# Patient Record
Sex: Female | Born: 1960 | Race: White | Hispanic: No | Smoking: Never smoker
Health system: Southern US, Community
[De-identification: ages and names within clinical notes are randomized; demographics above are authoritative.]

## PROBLEM LIST (undated history)

## (undated) DIAGNOSIS — F329 Major depressive disorder, single episode, unspecified: Secondary | ICD-10-CM

## (undated) DIAGNOSIS — F32A Depression, unspecified: Secondary | ICD-10-CM

## (undated) DIAGNOSIS — I10 Essential (primary) hypertension: Secondary | ICD-10-CM

## (undated) DIAGNOSIS — E669 Obesity, unspecified: Secondary | ICD-10-CM

## (undated) HISTORY — PX: CHOLECYSTECTOMY: SHX55

## (undated) HISTORY — DX: Major depressive disorder, single episode, unspecified: F32.9

## (undated) HISTORY — PX: ABDOMINAL HYSTERECTOMY: SHX81

## (undated) HISTORY — PX: APPENDECTOMY: SHX54

## (undated) HISTORY — DX: Obesity, unspecified: E66.9

## (undated) HISTORY — DX: Depression, unspecified: F32.A

---

## 2008-05-22 ENCOUNTER — Ambulatory Visit: Payer: Self-pay | Admitting: Family Medicine

## 2008-05-22 DIAGNOSIS — R740 Nonspecific elevation of levels of transaminase and lactic acid dehydrogenase [LDH]: Secondary | ICD-10-CM

## 2008-05-22 DIAGNOSIS — IMO0002 Reserved for concepts with insufficient information to code with codable children: Secondary | ICD-10-CM | POA: Insufficient documentation

## 2008-05-22 DIAGNOSIS — E1165 Type 2 diabetes mellitus with hyperglycemia: Secondary | ICD-10-CM | POA: Insufficient documentation

## 2008-05-22 DIAGNOSIS — R7401 Elevation of levels of liver transaminase levels: Secondary | ICD-10-CM | POA: Insufficient documentation

## 2008-05-22 DIAGNOSIS — F341 Dysthymic disorder: Secondary | ICD-10-CM | POA: Insufficient documentation

## 2008-05-22 LAB — CONVERTED CEMR LAB: Blood Glucose, Fasting: 152 mg/dL

## 2008-05-29 ENCOUNTER — Ambulatory Visit (HOSPITAL_COMMUNITY): Payer: Self-pay | Admitting: Psychology

## 2008-06-10 DIAGNOSIS — F502 Bulimia nervosa: Secondary | ICD-10-CM | POA: Insufficient documentation

## 2008-06-10 DIAGNOSIS — I10 Essential (primary) hypertension: Secondary | ICD-10-CM | POA: Insufficient documentation

## 2008-06-10 DIAGNOSIS — H8110 Benign paroxysmal vertigo, unspecified ear: Secondary | ICD-10-CM | POA: Insufficient documentation

## 2008-06-10 DIAGNOSIS — G43909 Migraine, unspecified, not intractable, without status migrainosus: Secondary | ICD-10-CM | POA: Insufficient documentation

## 2008-06-10 DIAGNOSIS — G4733 Obstructive sleep apnea (adult) (pediatric): Secondary | ICD-10-CM | POA: Insufficient documentation

## 2008-06-10 DIAGNOSIS — J309 Allergic rhinitis, unspecified: Secondary | ICD-10-CM | POA: Insufficient documentation

## 2008-06-12 ENCOUNTER — Ambulatory Visit: Payer: Self-pay | Admitting: Family Medicine

## 2008-06-12 LAB — CONVERTED CEMR LAB: Hgb A1c MFr Bld: 9.4 %

## 2008-08-13 ENCOUNTER — Encounter: Payer: Self-pay | Admitting: Family Medicine

## 2008-10-16 ENCOUNTER — Ambulatory Visit: Payer: Self-pay | Admitting: Family Medicine

## 2008-10-16 LAB — CONVERTED CEMR LAB: Hgb A1c MFr Bld: 10.4 %

## 2008-10-24 ENCOUNTER — Ambulatory Visit: Payer: Self-pay | Admitting: Family Medicine

## 2008-10-28 LAB — CONVERTED CEMR LAB
ALT: 50 units/L — ABNORMAL HIGH (ref 0–35)
CO2: 26 meq/L (ref 19–32)
Calcium: 9 mg/dL (ref 8.4–10.5)
Chloride: 102 meq/L (ref 96–112)
Cholesterol: 152 mg/dL (ref 0–200)
Creatinine, Ser: 0.6 mg/dL (ref 0.40–1.20)
Glucose, Bld: 197 mg/dL — ABNORMAL HIGH (ref 70–99)
Total CHOL/HDL Ratio: 3.5
Total Protein: 7.6 g/dL (ref 6.0–8.3)
Triglycerides: 82 mg/dL (ref <150)

## 2010-12-31 ENCOUNTER — Ambulatory Visit (INDEPENDENT_AMBULATORY_CARE_PROVIDER_SITE_OTHER): Payer: PRIVATE HEALTH INSURANCE | Admitting: Licensed Clinical Social Worker

## 2010-12-31 DIAGNOSIS — F4323 Adjustment disorder with mixed anxiety and depressed mood: Secondary | ICD-10-CM

## 2011-02-16 ENCOUNTER — Ambulatory Visit (INDEPENDENT_AMBULATORY_CARE_PROVIDER_SITE_OTHER): Payer: PRIVATE HEALTH INSURANCE | Admitting: Psychology

## 2011-02-16 ENCOUNTER — Encounter (HOSPITAL_COMMUNITY): Payer: PRIVATE HEALTH INSURANCE | Admitting: Psychology

## 2011-02-16 DIAGNOSIS — F331 Major depressive disorder, recurrent, moderate: Secondary | ICD-10-CM

## 2011-03-04 ENCOUNTER — Encounter (INDEPENDENT_AMBULATORY_CARE_PROVIDER_SITE_OTHER): Payer: PRIVATE HEALTH INSURANCE | Admitting: Psychology

## 2011-03-04 DIAGNOSIS — F331 Major depressive disorder, recurrent, moderate: Secondary | ICD-10-CM

## 2011-03-25 ENCOUNTER — Ambulatory Visit (INDEPENDENT_AMBULATORY_CARE_PROVIDER_SITE_OTHER): Payer: PRIVATE HEALTH INSURANCE | Admitting: Physician Assistant

## 2011-03-25 DIAGNOSIS — F331 Major depressive disorder, recurrent, moderate: Secondary | ICD-10-CM

## 2011-03-26 ENCOUNTER — Encounter (HOSPITAL_COMMUNITY): Payer: PRIVATE HEALTH INSURANCE | Admitting: Psychology

## 2011-04-07 ENCOUNTER — Encounter (HOSPITAL_COMMUNITY): Payer: PRIVATE HEALTH INSURANCE | Admitting: Psychology

## 2011-04-12 ENCOUNTER — Encounter (INDEPENDENT_AMBULATORY_CARE_PROVIDER_SITE_OTHER): Payer: PRIVATE HEALTH INSURANCE | Admitting: Physician Assistant

## 2011-04-12 DIAGNOSIS — F339 Major depressive disorder, recurrent, unspecified: Secondary | ICD-10-CM

## 2011-04-26 ENCOUNTER — Encounter (INDEPENDENT_AMBULATORY_CARE_PROVIDER_SITE_OTHER): Payer: PRIVATE HEALTH INSURANCE | Admitting: Psychology

## 2011-04-26 DIAGNOSIS — F411 Generalized anxiety disorder: Secondary | ICD-10-CM

## 2011-04-26 DIAGNOSIS — F331 Major depressive disorder, recurrent, moderate: Secondary | ICD-10-CM

## 2011-05-11 ENCOUNTER — Encounter (INDEPENDENT_AMBULATORY_CARE_PROVIDER_SITE_OTHER): Payer: PRIVATE HEALTH INSURANCE | Admitting: Psychology

## 2011-05-11 DIAGNOSIS — F331 Major depressive disorder, recurrent, moderate: Secondary | ICD-10-CM

## 2011-05-24 ENCOUNTER — Encounter (HOSPITAL_COMMUNITY): Payer: PRIVATE HEALTH INSURANCE | Admitting: Physician Assistant

## 2011-05-25 ENCOUNTER — Encounter (HOSPITAL_COMMUNITY): Payer: PRIVATE HEALTH INSURANCE | Admitting: Psychology

## 2011-06-17 ENCOUNTER — Encounter (INDEPENDENT_AMBULATORY_CARE_PROVIDER_SITE_OTHER): Payer: PRIVATE HEALTH INSURANCE | Admitting: Psychiatry

## 2011-06-17 DIAGNOSIS — F322 Major depressive disorder, single episode, severe without psychotic features: Secondary | ICD-10-CM

## 2011-06-21 ENCOUNTER — Encounter (HOSPITAL_COMMUNITY): Payer: Self-pay

## 2011-06-21 ENCOUNTER — Encounter (INDEPENDENT_AMBULATORY_CARE_PROVIDER_SITE_OTHER): Payer: PRIVATE HEALTH INSURANCE | Admitting: Psychology

## 2011-06-21 DIAGNOSIS — F331 Major depressive disorder, recurrent, moderate: Secondary | ICD-10-CM

## 2011-06-28 ENCOUNTER — Encounter (HOSPITAL_COMMUNITY): Payer: PRIVATE HEALTH INSURANCE | Admitting: Psychology

## 2011-07-05 ENCOUNTER — Encounter (HOSPITAL_COMMUNITY): Payer: PRIVATE HEALTH INSURANCE | Admitting: Psychology

## 2011-07-19 ENCOUNTER — Encounter (HOSPITAL_COMMUNITY): Payer: PRIVATE HEALTH INSURANCE | Admitting: Psychiatry

## 2011-07-19 ENCOUNTER — Telehealth (HOSPITAL_COMMUNITY): Payer: Self-pay | Admitting: Psychiatry

## 2011-07-19 NOTE — Telephone Encounter (Signed)
Jennifer Conrad called to cancel wife's appt this afternoon.  He was reminded by front desk that a cancellation the same day = a No Show.  He became very irate. And called multiple times insisting to talk with the psychiatrist.  Consultation with Jennifer Conrad, and [facility] therapist after learning she has had 4 missed appts.  Conversation with Jennifer Conrad given the psychosocial situation yields decision to see pt at her appt - to reshcedule if necessary to discuss need to make earlier reschedule plans if she cannot keep her appt. (safety issues are a concer).  Jennifer. Jennifer Conrad says she has been dismissed from a week of work and hangs up.  He is called again by Clinical research associate and pt Jennifer Conrad, is invited to keep her appointment.  He says she will. And hangs up.  NB  It is a concern by Clinical research associate and therapist Jennifer Conrad that pt does not call herself.  This will be discussed when she is in session.

## 2011-07-21 ENCOUNTER — Encounter (HOSPITAL_COMMUNITY): Payer: Self-pay | Admitting: Psychiatry

## 2011-07-21 ENCOUNTER — Ambulatory Visit (INDEPENDENT_AMBULATORY_CARE_PROVIDER_SITE_OTHER): Payer: PRIVATE HEALTH INSURANCE | Admitting: Psychiatry

## 2011-07-21 VITALS — BP 146/80 | HR 63 | Ht 63.0 in | Wt 223.0 lb

## 2011-07-21 DIAGNOSIS — F3289 Other specified depressive episodes: Secondary | ICD-10-CM

## 2011-07-21 DIAGNOSIS — F32A Depression, unspecified: Secondary | ICD-10-CM

## 2011-07-21 DIAGNOSIS — F329 Major depressive disorder, single episode, unspecified: Secondary | ICD-10-CM

## 2011-07-21 MED ORDER — BUPROPION HCL ER (XL) 150 MG PO TB24: 150.0000 mg | ORAL_TABLET | ORAL | Status: DC

## 2011-07-21 MED ORDER — ESCITALOPRAM OXALATE 20 MG PO TABS: 20.0000 mg | ORAL_TABLET | Freq: Every day | ORAL | Status: DC

## 2011-07-21 NOTE — Patient Instructions (Signed)
Refills of S lexapro and wellbutrin are ordered.  Call if any suicidal thoughts become overwhelming.  Or any side effects occur. Continue threapy with KL and return in 2 mos.

## 2011-07-21 NOTE — Progress Notes (Signed)
Pt arrives on time  Jennifer Conrad is a 50 y.o. 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  She was very distressed when last appt was cancelled as a no show.  The situation was reviewed when her husband called and became irate.  The record of no shows is reviewed and she is asked to make her own appts and call the day before to reschedule to avoid any no shows.  She says it was very difficult stating that her husband had made the appt then called that day to reminder her when she was in a all-day  training  Session.  She agrees to make her own appts  And call to cancel if needed.  She agrees to have all antidepressant medications written by Clinical research associate.  She says she will take time off as needed to keep appts here instead of the NP.  She says she was > 160 lbs and has gradually worked at losing weight and is pleased with her progress.  She is guarded but has no behavior or thought expression that is psychotic.  She is logical and sequential in thought.  She is goal oriented and has good judgement  In sight is fail. She has a dpressed mood that is better since taking the two antidepressants.

## 2011-07-28 ENCOUNTER — Ambulatory Visit (INDEPENDENT_AMBULATORY_CARE_PROVIDER_SITE_OTHER): Payer: PRIVATE HEALTH INSURANCE | Admitting: Psychology

## 2011-07-28 DIAGNOSIS — F332 Major depressive disorder, recurrent severe without psychotic features: Secondary | ICD-10-CM

## 2011-08-02 ENCOUNTER — Encounter (HOSPITAL_COMMUNITY): Payer: Self-pay | Admitting: Psychology

## 2011-08-02 NOTE — Progress Notes (Signed)
   THERAPIST PROGRESS NOTE  Note for 07/28/2011  Session Time: 300-400 pm  Participation Level: Active  Behavioral Response: Well GroomedAlertEuthymic  Type of Therapy: Family Therapy  Treatment Goals addressed: Anger, Communication: within marital relationship and Coping  Interventions: CBT, Solution Focused, Strength-based and Psychosocial Skills: communication  Summary: Jennifer Conrad is a 50 y.o. female who presents with her husband Jennifer Conrad to discuss marital issues. The patient has been discussing communication issues in individual and whether to stay in the marriage or not.  The patient requested a joint session to discuss her concerns.  The session appeared productive with both parties being able to provide input.  The patient and her husband were agreeable to making changes to help in the relationship.  The majority of the visit focused on communication barriers and individual perspective influencing reactions to the other person.  At several points this therapist intervened in their communication to correct unproductive interactions.  Suicidal/Homicidal: No  Plan: Return again in 2 weeks.  We discussed how to conduct sessions moving forward.  The patient wishes to continue meeting with both therapists present in order to know that she has an ally in the room.  Mr. Marrone didn't see the need but the patient was insistent that she have my support.  Diagnosis: Axis I: Major Depression, Recurrent severe    Axis II: No diagnosis    Jennifer Conrad, [place] 08/02/2011

## 2011-08-04 ENCOUNTER — Ambulatory Visit (INDEPENDENT_AMBULATORY_CARE_PROVIDER_SITE_OTHER): Payer: PRIVATE HEALTH INSURANCE | Admitting: Psychology

## 2011-08-04 ENCOUNTER — Encounter (HOSPITAL_COMMUNITY): Payer: Self-pay | Admitting: Psychology

## 2011-08-04 DIAGNOSIS — F411 Generalized anxiety disorder: Secondary | ICD-10-CM

## 2011-08-04 DIAGNOSIS — F332 Major depressive disorder, recurrent severe without psychotic features: Secondary | ICD-10-CM

## 2011-08-04 NOTE — Patient Instructions (Signed)
1. Create some opportunities for spending time with your daughter. 2. Stay aware of your interactions and communications with husband.
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
## 2011-08-18 ENCOUNTER — Other Ambulatory Visit (HOSPITAL_COMMUNITY): Payer: Self-pay | Admitting: Psychiatry

## 2011-08-18 ENCOUNTER — Encounter (HOSPITAL_COMMUNITY): Payer: Self-pay | Admitting: Psychology

## 2011-08-18 ENCOUNTER — Ambulatory Visit (INDEPENDENT_AMBULATORY_CARE_PROVIDER_SITE_OTHER): Payer: PRIVATE HEALTH INSURANCE | Admitting: Psychology

## 2011-08-18 DIAGNOSIS — F331 Major depressive disorder, recurrent, moderate: Secondary | ICD-10-CM

## 2011-08-18 NOTE — Patient Instructions (Signed)
1- Evaluate your actions and words and when you notice yourself doing something that doesn't feel good change the behavior/thought. 2- Listen when someone is talking to you and resist the urge to respond until you have heard and clarified the other person's meaning. 3- Stop putting yourself down; take the lesson and move forward, don't get stuck in beating yourself up! 4- Negotiate boundaries around discipline for your children. 5- Ask for help from [facility] when needed to maintain a neutral tone. 6- Speak in a whisper  7. Negotiate breakfast time.

## 2011-08-18 NOTE — Progress Notes (Signed)
   THERAPIST PROGRESS NOTE  Session Time: 400- 510 pm  Participation Level: Active  Behavioral Response: Well GroomedAlertEuthymic  Type of Therapy: Individual Therapy  Treatment Goals addressed: Anger, Anxiety, Communication: within family and Coping  Interventions: CBT, Solution Focused, Strength-based, Psychosocial Skills: communication and Supportive  Summary: Jennifer Conrad is a 50 y.o. female who presents with history of depression and anxiety. She has been doing better with her mood and admits that she is not sad or depressed but really wants to learn how to be happy.  She and her husband continue to work on their communication encounters.  She feels frustrated with him about him pushing daily family breakfast on her and the children since that occurs about 530 each morning.  His intent is good but it is not working.  She feels angry with him because he wants family breakfast but then he yells throughout most of the occurrence.  We talked about possible solutions to this issue in order to negotiate a workable solution, however she admits that her husband tends to be and all or none type of person.  After some discussions the patient was able to brainstorm some ideas including mom/dad one day, family two days, and voluntary breakfast two days with Saturday and Sunday on your own.  The patient's son was accountable for taking his own antidepressant but it was discovered that he had a full bottle of pills.  The patient admits that she knew something was different with her son but then was able to put together why with this new information.  She removed his ability for him to take his medication independently and he is now observed.  He is not pleased about this but she knows at this time this is the correct choice.  I suggested that she and her husband have admitted that they don't always take their medication and was it possible he learned this behavior; patient admits it is  possible.  The patient continues to work on building her relationship with her daughter.  She has had some good and helpful conversations with her but admits that she talks so much she doesn't always listen.  I asked her why she thinks she talks so much she stated she doesn't know but realizes that she doesn't talk as much to the patient's husband and to her brother.  I suggested that she might have been trying to get a reaction and that if she gave her the attention she appears to crave that she would probably notice a decrease in random conversation.  [name] will continue to invest in her relationship with her daughter.  Suicidal/Homicidal: No  Plan: Return again in 2 weeks.  Diagnosis: Axis I: Generalized Anxiety Disorder and Major depressive disroder moderate recurrent    Axis II: No diagnosis    Salley Scarlet, [facility] 08/18/2011

## 2011-08-19 ENCOUNTER — Telehealth (HOSPITAL_COMMUNITY): Payer: Self-pay | Admitting: Psychiatry

## 2011-08-19 NOTE — Telephone Encounter (Signed)
Pharmacy has been checked for prescription refill. It has been filled yesterday Wellbutrin XL 150 mg. Jennifer Conrad has been called, and asked to pick up her prescription.

## 2011-08-26 LAB — BASIC METABOLIC PANEL
Creatinine: 0.6 mg/dL (ref 0.5–1.1)
Glucose: 194 mg/dL

## 2011-08-26 LAB — LIPID PANEL: Triglycerides: 69 mg/dL (ref 40–160)

## 2011-09-02 LAB — MICROALBUMIN, URINE: Microalbumin, Urine: 50.9

## 2011-09-09 ENCOUNTER — Ambulatory Visit (INDEPENDENT_AMBULATORY_CARE_PROVIDER_SITE_OTHER): Payer: PRIVATE HEALTH INSURANCE | Admitting: Psychiatry

## 2011-09-09 DIAGNOSIS — F329 Major depressive disorder, single episode, unspecified: Secondary | ICD-10-CM

## 2011-09-09 DIAGNOSIS — F32A Depression, unspecified: Secondary | ICD-10-CM

## 2011-09-09 DIAGNOSIS — F3289 Other specified depressive episodes: Secondary | ICD-10-CM

## 2011-09-10 ENCOUNTER — Encounter (HOSPITAL_COMMUNITY): Payer: Self-pay | Admitting: Psychiatry

## 2011-09-10 MED ORDER — BUPROPION HCL ER (XL) 150 MG PO TB24: 150.0000 mg | ORAL_TABLET | ORAL | Status: DC

## 2011-09-10 MED ORDER — ESCITALOPRAM OXALATE 20 MG PO TABS: 20.0000 mg | ORAL_TABLET | Freq: Every day | ORAL | Status: DC

## 2011-09-10 NOTE — Progress Notes (Signed)
Patient ID: Jennifer Conrad, female   DOB: 09/30/1960, 51 y.o.   MRN: [id number] [name] states she is feeling much better than before. She is working at her job and feels that things are going well at this time. She is taking her medication with no side effects. She has no thoughts of suicide or homicide. She is casually dressed, cheerful and with affect and has good eye contact. She is continuing her therapy with Elray Buba.

## 2011-09-10 NOTE — Patient Instructions (Signed)
You have been given instruction for taking Lexapro and Wellbutrin XL. You also have prescription refills for 2 months. This will be increased by the fact that you have written prescriptions and ER ask the same refill amount. This will give you approximately 6 months of prescription medication. Please remember if you're having any suicidal thoughts to call the office, and [facility] [place] [phone]. Continue therapy with Fay Records and return in one to 2 months.

## 2011-09-15 ENCOUNTER — Encounter (HOSPITAL_COMMUNITY): Payer: Self-pay | Admitting: Psychology

## 2011-09-15 ENCOUNTER — Ambulatory Visit (INDEPENDENT_AMBULATORY_CARE_PROVIDER_SITE_OTHER): Payer: PRIVATE HEALTH INSURANCE | Admitting: Psychology

## 2011-09-15 DIAGNOSIS — F331 Major depressive disorder, recurrent, moderate: Secondary | ICD-10-CM

## 2011-09-15 DIAGNOSIS — F411 Generalized anxiety disorder: Secondary | ICD-10-CM
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
## 2011-10-08 HISTORY — PX: CARPAL TUNNEL RELEASE: SHX101

## 2012-01-16 ENCOUNTER — Emergency Department (INDEPENDENT_AMBULATORY_CARE_PROVIDER_SITE_OTHER)
Admission: EM | Admit: 2012-01-16 | Discharge: 2012-01-16 | Disposition: A | Discharge status: Home / Self Care | Payer: PRIVATE HEALTH INSURANCE | Source: Home / Self Care

## 2012-01-16 ENCOUNTER — Encounter: Payer: Self-pay | Admitting: Emergency Medicine

## 2012-01-16 DIAGNOSIS — N390 Urinary tract infection, site not specified: Secondary | ICD-10-CM

## 2012-01-16 DIAGNOSIS — R300 Dysuria: Secondary | ICD-10-CM

## 2012-01-16 HISTORY — DX: Essential (primary) hypertension: I10

## 2012-01-16 LAB — POCT URINALYSIS DIP (MANUAL ENTRY)
Bilirubin, UA: NEGATIVE
Glucose, UA: >=1000
Ketones, POC UA: NEGATIVE
pH, UA: 5.5 (ref 5–8)

## 2012-01-16 MED ORDER — CIPROFLOXACIN HCL 500 MG PO TABS: 500.0000 mg | ORAL_TABLET | Freq: Two times a day (BID) | ORAL | Status: AC

## 2012-01-16 NOTE — ED Notes (Signed)
Dysuria x 2 days.Took Ibuprofen today at 1100.

## 2012-01-16 NOTE — Discharge Instructions (Signed)
Urinary Tract Infection A urinary tract infection (UTI) is often caused by a germ (bacteria). A UTI is usually helped with medicine (antibiotics) that kills germs. Take all the medicine until it is gone. Do this even if you are feeling better. You are usually better in 7 to 10 days. HOME CARE   Drink enough water and fluids to keep your pee (urine) clear or pale yellow. Drink:   Cranberry juice.   Water.   Avoid:   Caffeine.   Tea.   Bubbly (carbonated) drinks.   Alcohol.   Only take medicine as told by your doctor.   To prevent further infections:   Pee often.   After pooping (bowel movement), women should wipe from front to back. Use each tissue only once.   Pee before and after having sex (intercourse).  Ask your doctor when your test results will be ready. Make sure you follow up and get your test results.  GET HELP RIGHT AWAY IF:   There is very bad back pain or lower belly (abdominal) pain.   You get the chills.   You have a fever.   Your baby is older than 3 months with a rectal temperature of 102 F (38.9 C) or higher.   Your baby is 3 months old or younger with a rectal temperature of 100.4 F (38 C) or higher.   You feel sick to your stomach (nauseous) or throw up (vomit).   There is continued burning with peeing.   Your problems are not better in 3 days. Return sooner if you are getting worse.  MAKE SURE YOU:   Understand these instructions.   Will watch your condition.   Will get help right away if you are not doing well or get worse.  Document Released: 02/09/2008 Document Revised: 08/12/2011 Document Reviewed: 02/09/2008 Exit[employer] Patient Information 2012 [employer]. 

## 2012-01-16 NOTE — ED Provider Notes (Signed)
History     CSN: [id number]  Arrival date & time 01/16/12  1215   None     Chief Complaint  Patient presents with  . Dysuria    (Consider location/radiation/quality/duration/timing/severity/associated sxs/prior treatment) Patient is a 51 y.o. female presenting with dysuria. The history is provided by the patient.  Dysuria    DYSURIA  Onset:  2  Days  Worsening:  yes     Symptoms: Urgency:  yes  Frequency: yes  Hesitancy: yes  Hematuria: yes  Flank Pain: yes  Fever: no  Nausea/Vomiting: no  Pregnant: no STD exposure/history: no Discharge: no Irritants: no Rash: no  Red Flags  : Diabetic with hx of recurrent UTI Recent Antibiotic Usage (last 30 days): no  Symptoms lasting more than seven (7) days: no  More than 3 UTI's last 12 months: no  PMH of  1. DM: yes 2. Renal Disease/Calculi: no 3. Urinary Tract Abnormality: no 4. Instrumentation/Trauma: no 5. Immunosuppression: no  Past Medical History  Diagnosis Date  . Obesity (BMI 30-39.9)     alowly losing weight/smaller portions/exercise 3 X week  . Depression   . Diabetes mellitus   . Hypertension     Past Surgical History  Procedure Date  . Abdominal hysterectomy     Family History  Problem Relation Age of Onset  . Depression Sister   . Drug abuse Cousin   . Depression Sister   . Diabetes Mother   . Cancer Mother   . Cancer Father     History  Substance Use Topics  . Smoking status: Never Smoker   . Smokeless tobacco: Never Used  . Alcohol Use: No    OB History    Grav Para Term Preterm Abortions TAB SAB Ect Mult Living                  Review of Systems  Genitourinary: Positive for dysuria.  All other systems reviewed and are negative.    Allergies  Review of patient's allergies indicates no known allergies.  Home Medications   Current Outpatient Rx  Name Route Sig Dispense Refill  . AMLODIPINE BESY-BENAZEPRIL HCL 10-40 MG PO CAPS      . BUPROPION HCL ER (XL) 150 MG PO  TB24 Oral Take 1 tablet (150 mg total) by mouth every morning. 30 tablet 2  . ESCITALOPRAM OXALATE 20 MG PO TABS Oral Take 1 tablet (20 mg total) by mouth daily. 30 tablet 2  . NOVOLIN 70/30 (70-30) 100 UNIT/ML [place] SUSP      . ATENOLOL 25 MG PO TABS      . CIPROFLOXACIN HCL 500 MG PO TABS Oral Take 1 tablet (500 mg total) by mouth 2 (two) times daily. For 7 days 14 tablet 0  . HYDROCHLOROTHIAZIDE 25 MG PO TABS      . METFORMIN HCL 1000 MG PO TABS      . NOVOLOG MIX 70/30 FLEXPEN (70-30) 100 UNIT/ML [place] SUSP      . OMEPRAZOLE 20 MG PO CPDR      . OXYBUTYNIN CHLORIDE ER 10 MG PO TB24      . TRIAMCINOLONE ACETONIDE 0.5 % EX CREA        BP 143/84  Pulse 78  Temp(Src) 98.7 F (37.1 C) (Oral)  Resp 16  Ht 5\' 2"  (1.575 m)  Wt 219 lb (99.338 kg)  BMI 40.06 kg/m2  SpO2 94%  Physical Exam  Nursing note and vitals reviewed. Constitutional: She is oriented to person, place,  and time. Vital signs are normal. She appears well-developed and well-nourished. She is active and cooperative.  HENT:  Head: Normocephalic.  Eyes: Conjunctivae are normal. Pupils are equal, round, and reactive to light. No scleral icterus.  Neck: Trachea normal. Neck supple.  Cardiovascular: Normal rate, regular rhythm, normal heart sounds and normal pulses.   Pulmonary/Chest: Effort normal and breath sounds normal.  Abdominal: Soft. Normal appearance and bowel sounds are normal. There is no hepatosplenomegaly, splenomegaly or hepatomegaly. There is tenderness in the suprapubic area. There is CVA tenderness. There is no rebound.  Neurological: She is alert and oriented to person, place, and time. No cranial nerve deficit or sensory deficit. GCS eye subscore is 4. GCS verbal subscore is 5. GCS motor subscore is 6.  Skin: Skin is warm and dry.  Psychiatric: She has a normal mood and affect. Her speech is normal and behavior is normal. Judgment and thought content normal. Cognition and memory are normal.    ED Course    Procedures (including critical care time)   Labs Reviewed  POCT URINALYSIS DIP (MANUAL ENTRY) - Abnormal; Notable for the following:   URINE CULTURE  POCT URINALYSIS DIP (MANUAL ENTRY)   No results found. Urine dip: + leuks, + blood, +nitrates, - protein, >1000 glucose  1. Urinary tract infection, acute   2. Dysuria       MDM  Increase fluids, take antibiotic as prescribed.  Await culture results, we will call if there is a change in medications.  Follow up with your primary care provider in 1-2 weeks for diabetes management.         Johnsie Kindred, NP 01/16/12 1306

## 2012-01-16 NOTE — ED Provider Notes (Signed)
Agree with exam, assessment, and plan.   Lattie Haw, MD 01/16/12 [phone]

## 2012-01-19 LAB — URINE CULTURE: Colony Count: >=100000

## 2012-02-04 ENCOUNTER — Encounter: Payer: Self-pay | Admitting: Physician Assistant

## 2012-02-04 ENCOUNTER — Ambulatory Visit (INDEPENDENT_AMBULATORY_CARE_PROVIDER_SITE_OTHER): Payer: PRIVATE HEALTH INSURANCE | Admitting: Physician Assistant

## 2012-02-04 VITALS — BP 152/83 | HR 91 | Temp 98.2°F | Wt 216.0 lb

## 2012-02-04 DIAGNOSIS — F329 Major depressive disorder, single episode, unspecified: Secondary | ICD-10-CM

## 2012-02-04 DIAGNOSIS — R110 Nausea: Secondary | ICD-10-CM

## 2012-02-04 DIAGNOSIS — Z13220 Encounter for screening for lipoid disorders: Secondary | ICD-10-CM

## 2012-02-04 DIAGNOSIS — F3289 Other specified depressive episodes: Secondary | ICD-10-CM

## 2012-02-04 DIAGNOSIS — F32A Depression, unspecified: Secondary | ICD-10-CM

## 2012-02-04 DIAGNOSIS — F411 Generalized anxiety disorder: Secondary | ICD-10-CM

## 2012-02-04 DIAGNOSIS — F419 Anxiety disorder, unspecified: Secondary | ICD-10-CM

## 2012-02-04 DIAGNOSIS — I10 Essential (primary) hypertension: Secondary | ICD-10-CM

## 2012-02-04 DIAGNOSIS — E119 Type 2 diabetes mellitus without complications: Secondary | ICD-10-CM | POA: Insufficient documentation

## 2012-02-04 DIAGNOSIS — R1013 Epigastric pain: Secondary | ICD-10-CM

## 2012-02-04 DIAGNOSIS — G4730 Sleep apnea, unspecified: Secondary | ICD-10-CM

## 2012-02-04 LAB — CBC WITH DIFFERENTIAL/PLATELET
Eosinophils Absolute: 0.3 10*3/uL (ref 0.0–0.7)
HCT: 46.2 % — ABNORMAL HIGH (ref 36.0–46.0)
Hemoglobin: 14.8 g/dL (ref 12.0–15.0)
Lymphs Abs: 2.4 10*3/uL (ref 0.7–4.0)
MCH: 26.8 pg (ref 26.0–34.0)
Monocytes Absolute: 0.3 10*3/uL (ref 0.1–1.0)
Monocytes Relative: 4 % (ref 3–12)
Neutro Abs: 5.6 10*3/uL (ref 1.7–7.7)
Neutrophils Relative %: 64 % (ref 43–77)
RBC: 5.53 MIL/uL — ABNORMAL HIGH (ref 3.87–5.11)

## 2012-02-04 LAB — POCT GLYCOSYLATED HEMOGLOBIN (HGB A1C): Hemoglobin A1C: 12.1

## 2012-02-04 LAB — COMPLETE METABOLIC PANEL WITH GFR
ALT: 34 U/L (ref 0–35)
AST: 30 U/L (ref 0–37)
Albumin: 4.1 g/dL (ref 3.5–5.2)
CO2: 31 mEq/L (ref 19–32)
Calcium: 8.9 mg/dL (ref 8.4–10.5)
Chloride: 94 mEq/L — ABNORMAL LOW (ref 96–112)
Creat: 0.7 mg/dL (ref 0.50–1.10)
Potassium: 4.3 mEq/L (ref 3.5–5.3)
Sodium: 134 mEq/L — ABNORMAL LOW (ref 135–145)
Total Protein: 7.7 g/dL (ref 6.0–8.3)

## 2012-02-04 LAB — LIPASE: Lipase: 33 U/L (ref 0–75)

## 2012-02-04 MED ORDER — BUPROPION HCL ER (XL) 150 MG PO TB24: 300.0000 mg | ORAL_TABLET | ORAL | Status: DC

## 2012-02-04 NOTE — Progress Notes (Signed)
Subjective:    Patient ID: Jennifer Conrad, female    DOB: January 01, 1961, 51 y.o.   MRN: [id number]  HPI Presents to the clinic to establish care. PMH reviewed. Hx of diabetes, depression/anxiety, and HTN.  She has a new problem of abdominal pain for the last 4 weeks. The pains come and go but most recent episdode started this morning. She went to ER 3 days ago. CT scan was normal of abdomen. They did give carafate to use up to four times a day for stomach pain. Patient does not believe it is helping. She has noticed that some spicy foods and sugar irritates her stomach more. The pain is usually worse after she eats. She had h.pylori 1 year ago for ulcer and was treated. Symptoms resolved.  Sugar irriates stomach. No pepsi. No changes in bowel movements, no dysuria, no nausea or vomiting. She does not have her gallbladder  She does have DM type II she has not been watching her diet or exercising. Last A1C was 9.4 in January. She takes metformin and Novolog 70/30 twice a day. She does not check blood sugar. Denies any hypoglycemia episodes. Denies any CP, vision changes, bilateral feet numbess, ulcers.   She feels like her anxiety and depression are worse. She is having to quit work to take care of son who has been kicked out of school and has behavioral issues. She denies any thoughts of suicide. She is more anxious than depressed. Wellbutrin had worked well in the past but with everything happening feels like not working.   BP high today but has not been running high.   She has not been sleeping well at night. She has sleep apnea but has not been using CPAP. She hates wearing mask.     Review of Systems     Objective:   Physical Exam  Constitutional: She is oriented to person, place, and time. She appears well-developed and well-nourished.  HENT:  Head: Normocephalic and atraumatic.  Eyes: Conjunctivae are normal.  Cardiovascular: Normal rate, regular rhythm and normal heart sounds.     Pulmonary/Chest: Effort normal and breath sounds normal. She has no wheezes.  Abdominal:       Normal bowel sounds. Tenderness in epigastric region with deep palpation. Negative Murphys sign.   Neurological: She is alert and oriented to person, place, and time.  Skin: Skin is warm and dry.  Psychiatric: She has a normal mood and affect. Her behavior is normal.          Assessment & Plan:  Diabetes Mellitus- A1c was 12.1 today.Check fasting blood sugar every morning and keep diary. If below 120 then stay at same dose and call office. Start at 20units of Lantus every night increasing by 3 units unit get to 120. Goal is fasting to be under 100. Recheck 6 weeks. Exercise and diet. Walking at fast pace for a weeks.   Epigastric pain/nausea- Will check h.pylori and call with results. Increased omeprazole 40mg  before breakfast. Continue using carafate 4 times a day. If no dx or not improving with get upper GI and send to [facility] for eval.   Anxiety and Depression- Will increase Wellbutrin to 300mg  daily. Will recheck in 6 weeks. Consider making time for self and talking out situations with others when get overwhelmed. Counseling may be of benefit.   Sleep apnea- Needs to start wearing CPAP. Discussed importance of sleep and if not doing the things she needs to do then hard for [place] to help  her.   HTN- Will continue on same medications and watch closely. We may need to add medications or increase if not decrease in next 6 weeks.

## 2012-02-04 NOTE — Patient Instructions (Addendum)
Increased Wellbutrin to 300mg  daily. Increased omeprazole to 40mg  daily before breakfast. Continue using carafate up to four times a day. Will get labs to evaluate for h.pyolori. If not improving and no clear diagnosis then we will get upper GI to look at stomach.   Check fasting blood sugar every morning. If below 120 then stay at same dose and call office. Start at 20units of Lantus every night increasing by 3 units unit get to 120. Goal is fasting to be under 100. Recheck 6 weeks. Exercise and diet. Walking at fast pace for a weeks.   Peptic Ulcers Ulcers are small, open craters or sores that develop in the lining of the stomach or the duodenum (the first part of the small intestine). The term peptic ulcer is used to describe both types of ulcers. There are a number of treatments that relieve the discomfort of ulcers. In most cases ulcers do heal.  CAUSES AND COMMON FEATURES OF PEPTIC ULCERS  Peptic ulcers occur only in areas of the digestive system that come in contact with digestive juices. These juices are secreted (given off) by the stomach. They include acid and an enzyme called pepsin that breaks down proteins. Many people with duodenal ulcers have too much digestive juice spilling down from the stomach. Most people with gastric (stomach) ulcers have normal or below normal amounts of stomach acid. Sometimes, when the mucous membrane (protective lining) of the stomach and duodenum does not protect well, this may add to the growth of ulcers. Duodenal ulcers often produces pain in a small area between the breastbone and navel. Pain varies from hunger pain to constant gnawing or burning sensations (feeling). Sometimes the pain is felt during sleep and may awaken the person in the middle of the night. Often the pain occurs two or three hours after eating, when the stomach is empty. Other common symptoms (problems) include overeating for pain relief. Eating relieves the pain of a duodenal  ulcer. Gastric ulcer pain may be felt in the same place as the pain of a duodenal ulcer, or slightly higher up. There may also be sensations of feeling full, indigestion, and heartburn. Sometimes pain occurs when the stomach is full. This causes loss of appetite followed by weight loss. HOME CARE INSTRUCTIONS   Use of tobacco products have been found to slow down the healing of an ulcer. STOP SMOKING.   Avoid alcohol, aspirin, and other inflammation (swelling and soreness) reducing drugs. These substances weaken the stomach lining.   Eat regular, nutritious meals.   Avoid foods that bother you.   Take medications and antacids as directed. Over-the-counter medications are used to neutralize stomach acid. Prescription medications reduce acid secretion, block acid production, or provide a protective coating over the ulcer. If a specific antacid was prescribed, do not switch brands without your caregiver's approval.  Surgery is usually not necessary. Diet and/or drug therapy usually is effective. Surgery may be necessary if perforation, obstruction due to scarring, or uncontrollable bleeding is found, or if severe pain is not otherwise controlled. SEEK IMMEDIATE MEDICAL CARE IF:  You see signs of bleeding. This includes vomiting fresh, bright red blood or passing bloody or tarry, black stools.   You suffer weakness, fatigue, or loss of consciousness. These symptoms can result from severe hemorrhaging (bleeding). Shock may result.   You have sudden, intense, severe abdominal (belly) pain. This is the first sign of a perforation. This would require immediate surgical treatment.   You have intense pain and continued  vomiting. This could signal an obstruction of the digestive tract.  Document Released: 08/20/2000 Document Revised: 08/12/2011 Document Reviewed: 08/19/2008 [facility] Patient Information 2012 [place], [place].

## 2012-02-07 ENCOUNTER — Other Ambulatory Visit: Payer: Self-pay | Admitting: Physician Assistant

## 2012-02-07 ENCOUNTER — Encounter: Payer: Self-pay | Admitting: Physician Assistant

## 2012-02-07 DIAGNOSIS — A048 Other specified bacterial intestinal infections: Secondary | ICD-10-CM

## 2012-02-07 LAB — LIPID PANEL
LDL Cholesterol: 90 mg/dL (ref 0–99)
VLDL: 17 mg/dL (ref 0–40)

## 2012-02-07 MED ORDER — METRONIDAZOLE 500 MG PO TABS: 500.0000 mg | ORAL_TABLET | Freq: Three times a day (TID) | ORAL | Status: AC

## 2012-02-07 MED ORDER — CLARITHROMYCIN 500 MG PO TABS: 500.0000 mg | ORAL_TABLET | Freq: Two times a day (BID) | ORAL | Status: AC

## 2012-02-07 MED ORDER — OMEPRAZOLE 20 MG PO CPDR: 20.0000 mg | DELAYED_RELEASE_CAPSULE | Freq: Two times a day (BID) | ORAL | Status: DC

## 2012-02-07 NOTE — Progress Notes (Signed)
Given treatment for 14 days. Will check for eradication in 1 month with breath test if symptoms not gone.

## 2012-02-21 ENCOUNTER — Other Ambulatory Visit: Payer: Self-pay | Admitting: *Deleted

## 2012-02-21 MED ORDER — INSULIN GLARGINE 100 UNIT/ML ~~LOC~~ SOLN: 20.0000 [IU] | Freq: Every day | SUBCUTANEOUS | Status: DC

## 2012-02-21 MED ORDER — "PEN NEEDLES 3/16"" 31G X 5 MM MISC": 1.0000 [IU] | Freq: Every day | Status: DC

## 2012-02-28 ENCOUNTER — Ambulatory Visit (HOSPITAL_COMMUNITY): Payer: Self-pay | Admitting: Psychology

## 2012-03-01 ENCOUNTER — Encounter: Payer: Self-pay | Admitting: *Deleted

## 2012-03-03 ENCOUNTER — Other Ambulatory Visit: Payer: Self-pay | Admitting: Physician Assistant

## 2012-03-03 ENCOUNTER — Telehealth: Payer: Self-pay | Admitting: *Deleted

## 2012-03-03 DIAGNOSIS — R1013 Epigastric pain: Secondary | ICD-10-CM

## 2012-03-03 DIAGNOSIS — K219 Gastro-esophageal reflux disease without esophagitis: Secondary | ICD-10-CM

## 2012-03-03 MED ORDER — PANTOPRAZOLE SODIUM 40 MG PO TBEC: 40.0000 mg | DELAYED_RELEASE_TABLET | Freq: Every day | ORAL | Status: DC

## 2012-03-03 MED ORDER — SUCRALFATE 1 G PO TABS: 1.0000 g | ORAL_TABLET | Freq: Four times a day (QID) | ORAL | Status: DC

## 2012-03-03 NOTE — Telephone Encounter (Signed)
Seen a few weeks ago for stomach issues, given meds for H Pylori- taken all antibiotics. Now stomach is "killing" her. Used all the carafate and this did help some and "living off the pain meds given to her by ED doc". Please advise

## 2012-03-03 NOTE — Telephone Encounter (Signed)
Will give protonix to replace omeprazole. Will refill Carafare. Will refer to GI for EGD.

## 2012-03-03 NOTE — Telephone Encounter (Signed)
Pt.notified

## 2012-03-20 ENCOUNTER — Encounter: Payer: Self-pay | Admitting: Physician Assistant

## 2012-03-20 ENCOUNTER — Ambulatory Visit (INDEPENDENT_AMBULATORY_CARE_PROVIDER_SITE_OTHER): Payer: PRIVATE HEALTH INSURANCE | Admitting: Physician Assistant

## 2012-03-20 VITALS — BP 135/80 | HR 79 | Ht 62.0 in | Wt 218.0 lb

## 2012-03-20 DIAGNOSIS — Z1211 Encounter for screening for malignant neoplasm of colon: Secondary | ICD-10-CM

## 2012-03-20 DIAGNOSIS — J309 Allergic rhinitis, unspecified: Secondary | ICD-10-CM

## 2012-03-20 DIAGNOSIS — F341 Dysthymic disorder: Secondary | ICD-10-CM

## 2012-03-20 DIAGNOSIS — Z1239 Encounter for other screening for malignant neoplasm of breast: Secondary | ICD-10-CM

## 2012-03-20 DIAGNOSIS — IMO0001 Reserved for inherently not codable concepts without codable children: Secondary | ICD-10-CM

## 2012-03-20 MED ORDER — AMBULATORY NON FORMULARY MEDICATION: Status: DC

## 2012-03-20 NOTE — Patient Instructions (Addendum)
Continue with protonix. Follow up if not continuing to improve. Continue to monitor blood sugars, exercise, and make diet changes. Will recheck A1C in 2 months!  Will refer for colonoscopy and mammogram.  Diet for GERD or PUD Nutrition therapy can help ease the discomfort of gastroesophageal reflux disease (GERD) and peptic ulcer disease (PUD).  HOME CARE INSTRUCTIONS   Eat your meals slowly, in a relaxed setting.   Eat 5 to 6 small meals per day.   If a food causes distress, stop eating it for a period of time.  FOODS TO AVOID  Coffee, regular or decaffeinated.   Cola beverages, regular or low calorie.   Tea, regular or decaffeinated.   Pepper.   Cocoa.   High fat foods, including meats.   Butter, margarine, hydrogenated oil (trans fats).   Peppermint or spearmint (if you have GERD).   Fruits and vegetables if not tolerated.   Alcohol.   Nicotine (smoking or chewing). This is one of the most potent stimulants to acid production in the gastrointestinal tract.   Any food that seems to aggravate your condition.  If you have questions regarding your diet, ask your caregiver or a registered dietitian. TIPS  Lying flat may make symptoms worse. Keep the head of your bed raised 6 to 9 inches (15 to 23 cm) by using a foam wedge or blocks under the legs of the bed.   Do not lay down until 3 hours after eating a meal.   Daily physical activity may help reduce symptoms.  MAKE SURE YOU:   Understand these instructions.   Will watch your condition.   Will get help right away if you are not doing well or get worse.  Document Released: 08/23/2005 Document Revised: 08/12/2011 Document Reviewed: 07/09/2011 [facility] Patient Information 2012 [place], [place].

## 2012-03-20 NOTE — Progress Notes (Signed)
  Subjective:    Patient ID: Jennifer Conrad, female    DOB: [birth year], 51 y.o.   MRN: [id number]  Diabetes She presents for her follow-up diabetic visit. She has type 2 diabetes mellitus. No MedicAlert identification noted. Her disease course has been improving. (She has had 1 or 2 hypoglycemia episdoes in the morning after not eating supper where sugars have been 42 and 64 and she felt very weak.) There are no diabetic associated symptoms. There are no hypoglycemic complications. Symptoms are stable. There are no diabetic complications. Risk factors for coronary artery disease include diabetes mellitus, stress, hypertension and obesity. Current diabetic treatment includes diet, insulin injections and oral agent (monotherapy). She is compliant with treatment all of the time. Her weight is stable. She is following a diabetic and generally healthy diet. Meal planning includes avoidance of concentrated sweets. She has not had a previous visit with a dietician. She participates in exercise three times a week. Frequency home blood tests: She is testing every morning fasting. Her home blood glucose trend is decreasing steadily. (Fasting blood sugars running 95-110.) An ACE inhibitor/angiotensin II receptor blocker is being taken. She does not see a podiatrist.Eye exam is not current.   Patient presents to the clinic to follow up on diabetes, Depression/Anxiety, and GERD.   GERD- much better after switching to protonix from omeprazole. Finished treatment for h.pylori. Has never been given handout for GERD diet. Drinks a lot of diet pepsi every day.   Depression/anxiety- Much better with increase of wellbutrin. Feeling a lot happier and like she has much more energy. Exercising at least 3 times a week and eating much better.   Needs mammogram and colonoscopy.   Review of Systems     Objective:   Physical Exam  Constitutional: She is oriented to person, place, and time. She appears well-developed and  well-nourished.       Obese.  HENT:  Head: Normocephalic and atraumatic.  Cardiovascular: Normal rate, regular rhythm, normal heart sounds and intact distal pulses.        Pedal pulses 2+.  Pulmonary/Chest: Effort normal and breath sounds normal. She has no wheezes.  Neurological: She is alert and oriented to person, place, and time.  Skin: Skin is warm and dry.  Psychiatric: She has a normal mood and affect. Her behavior is normal.          Assessment & Plan:  Diabetes mellitus type II, uncontrolled- Gave rx refill for test strips. Reports fasting blood sugars 95-110 in am. Continue exercising a week and increase as tolerated. Watch portion sizes and choose foods with low glycemic index. Patient is doing great with diet and regular exercise. Will recheck A1C in 2 months and at that time do a foot exam, mircoalbumin. Reminded of yearly eye exam.  HTN- Pt is taking all of blood pressure medications. Reports blood pressure at home are running in the 120's/80's. Will not make any changes today. Will recheck in 2 months. Pt educated that if she starts to lose weight her blood pressure should decrease.   Depression/Anxiety- Well controlled on Wellbutrin 300mg  XL. Will refill as needed.   GERD- Switched from omeprazole to protonix. Doing much better with epigastric pain and acid reflux. Patient canceled GI appt because she feels much better. Gave handout on GERD diet. Follow up as needed.   Needs mammogram and colonoscopy. Will refer.

## 2012-04-04 ENCOUNTER — Ambulatory Visit (INDEPENDENT_AMBULATORY_CARE_PROVIDER_SITE_OTHER): Payer: PRIVATE HEALTH INSURANCE

## 2012-04-04 DIAGNOSIS — Z1239 Encounter for other screening for malignant neoplasm of breast: Secondary | ICD-10-CM

## 2012-04-04 DIAGNOSIS — R928 Other abnormal and inconclusive findings on diagnostic imaging of breast: Secondary | ICD-10-CM

## 2012-04-06 ENCOUNTER — Other Ambulatory Visit: Payer: Self-pay | Admitting: Physician Assistant

## 2012-04-06 DIAGNOSIS — R928 Other abnormal and inconclusive findings on diagnostic imaging of breast: Secondary | ICD-10-CM

## 2012-04-14 ENCOUNTER — Ambulatory Visit
Admission: RE | Admit: 2012-04-14 | Discharge: 2012-04-14 | Disposition: A | Discharge status: Home / Self Care | Payer: PRIVATE HEALTH INSURANCE | Source: Ambulatory Visit | Attending: Physician Assistant | Admitting: Physician Assistant

## 2012-04-14 DIAGNOSIS — R928 Other abnormal and inconclusive findings on diagnostic imaging of breast: Secondary | ICD-10-CM

## 2012-04-30 ENCOUNTER — Other Ambulatory Visit: Payer: Self-pay | Admitting: Physician Assistant

## 2012-05-12 ENCOUNTER — Emergency Department
Admission: EM | Admit: 2012-05-12 | Discharge: 2012-05-12 | Disposition: A | Discharge status: Home / Self Care | Payer: Self-pay | Source: Home / Self Care | Attending: Emergency Medicine | Admitting: Emergency Medicine

## 2012-05-12 DIAGNOSIS — Z111 Encounter for screening for respiratory tuberculosis: Secondary | ICD-10-CM

## 2012-05-12 DIAGNOSIS — IMO0001 Reserved for inherently not codable concepts without codable children: Secondary | ICD-10-CM

## 2012-05-12 MED ORDER — TUBERCULIN PPD 5 UNIT/0.1ML ID SOLN
5.0000 [IU] | Freq: Once | INTRADERMAL | Status: AC
  Administered 2012-05-12: 5 [IU] via INTRADERMAL

## 2012-05-12 NOTE — ED Provider Notes (Signed)
History     CSN: [id number]  Arrival date & time 05/12/12  1649   First MD Initiated Contact with Patient 05/12/12 1742      No chief complaint on file.   (Consider location/radiation/quality/duration/timing/severity/associated sxs/prior treatment) HPI  Past Medical History  Diagnosis Date  . Obesity (BMI 30-39.9)     alowly losing weight/smaller portions/exercise 3 X week  . Depression   . Diabetes mellitus   . Hypertension     Past Surgical History  Procedure Date  . Abdominal hysterectomy   . Cholecystectomy   . Carpal tunnel release 10/2011    Family History  Problem Relation Age of Onset  . Depression Sister   . Drug abuse Cousin   . Depression Sister   . Diabetes Mother   . Cancer Mother   . Cancer Father     History  Substance Use Topics  . Smoking status: Never Smoker   . Smokeless tobacco: Never Used  . Alcohol Use: No    OB History    Grav Para Term Preterm Abortions TAB SAB Ect Mult Living                  Review of Systems  Allergies  Review of patient's allergies indicates not on file.  Home Medications   Current Outpatient Rx  Name Route Sig Dispense Refill  . AMBULATORY NON FORMULARY MEDICATION  Medication Name: One touch ultra mini strips  To test blood sugar three times a day. 100 strip 11  . AMLODIPINE BESY-BENAZEPRIL HCL 10-40 MG PO CAPS  TAKE 1 TABLET BY MOUTH DAILY 90 capsule 2    PT IS OUT OF REFILLS AND SAYS SHE GETS THIS FROM Y ...  . ATENOLOL 25 MG PO TABS      . BUPROPION HCL ER (XL) 150 MG PO TB24 Oral Take 2 tablets (300 mg total) by mouth every morning. 60 tablet 2  . ESCITALOPRAM OXALATE 20 MG PO TABS Oral Take 1 tablet (20 mg total) by mouth daily. 30 tablet 2  . HYDROCHLOROTHIAZIDE 25 MG PO TABS      . INSULIN GLARGINE 100 UNIT/ML [place] SOLN Subcutaneous Inject 20 Units into the skin at bedtime. 3 mL 6    Solostar Pen  . PEN NEEDLES 3/16" 31G X 5 MM MISC Does not apply 1 Units by Does not apply route daily. 100 each 1   . METFORMIN HCL 1000 MG PO TABS      . NOVOLIN 70/30 (70-30) 100 UNIT/ML [place] SUSP      . OXYBUTYNIN CHLORIDE ER 10 MG PO TB24      . PANTOPRAZOLE SODIUM 40 MG PO TBEC Oral Take 1 tablet (40 mg total) by mouth daily. 30 tablet 1  . SUCRALFATE 1 G PO TABS Oral Take 1 tablet (1 g total) by mouth 4 (four) times daily. 60 tablet 1  . TRIAMCINOLONE ACETONIDE 0.5 % EX CREA        Temp 98.7 F (37.1 C)  Physical Exam  ED Course  Procedures (including critical care time)  Labs Reviewed - No data to display No results found.   No diagnosis found.    MDM  This was a nurse visit only for PPD application at patient's request. Return in 2-3 days to have skin test read        Lajean Manes, MD 05/12/12 1840

## 2012-05-22 ENCOUNTER — Ambulatory Visit: Payer: Self-pay | Admitting: Physician Assistant

## 2012-05-22 DIAGNOSIS — Z0289 Encounter for other administrative examinations: Secondary | ICD-10-CM

## 2012-05-30 ENCOUNTER — Other Ambulatory Visit: Payer: Self-pay | Admitting: Physician Assistant

## 2012-06-20 ENCOUNTER — Other Ambulatory Visit: Payer: Self-pay | Admitting: Physician Assistant

## 2012-06-23 ENCOUNTER — Ambulatory Visit (INDEPENDENT_AMBULATORY_CARE_PROVIDER_SITE_OTHER): Payer: PRIVATE HEALTH INSURANCE | Admitting: Physician Assistant

## 2012-06-23 ENCOUNTER — Encounter: Payer: Self-pay | Admitting: Physician Assistant

## 2012-06-23 VITALS — BP 150/72 | HR 94 | Ht 62.25 in | Wt 225.0 lb

## 2012-06-23 DIAGNOSIS — Z23 Encounter for immunization: Secondary | ICD-10-CM

## 2012-06-23 DIAGNOSIS — R292 Abnormal reflex: Secondary | ICD-10-CM

## 2012-06-23 DIAGNOSIS — Z0000 Encounter for general adult medical examination without abnormal findings: Secondary | ICD-10-CM

## 2012-06-23 DIAGNOSIS — E669 Obesity, unspecified: Secondary | ICD-10-CM

## 2012-06-23 DIAGNOSIS — L659 Nonscarring hair loss, unspecified: Secondary | ICD-10-CM

## 2012-06-23 DIAGNOSIS — E119 Type 2 diabetes mellitus without complications: Secondary | ICD-10-CM

## 2012-06-23 LAB — POCT GLYCOSYLATED HEMOGLOBIN (HGB A1C): Hemoglobin A1C: 10.5

## 2012-06-23 MED ORDER — INSULIN ASPART 100 UNIT/ML ~~LOC~~ SOLN: SUBCUTANEOUS | Status: DC

## 2012-06-23 NOTE — Patient Instructions (Addendum)
Start using sliding scale insulin.   Keep log of blood sugars. Follow up in 3 months. Increase lantus by 3 units every 3 days until get to around 100. Will schedule nutritionist.

## 2012-06-23 NOTE — Progress Notes (Signed)
Subjective:    Patient ID: Jennifer Conrad, female    DOB: 1960-10-09, 52 y.o.   MRN: [id number]  HPI    Review of Systems     Objective:   Physical Exam          Assessment & Plan:     Subjective:     Jennifer Conrad is a 51 y.o. female and is here for a comprehensive physical exam. The patient reports problems - She knows that her sugars have not been controlled. She has been stressed over the past couple of months. She has not been exercising and eating whatever she wants. Her marriage is not doing well and she reports comforting herself with food. She did recently get a new job and she wants to start eating better and exercising more.    Last lipid checked in June and great.   History   Social History  . Marital Status: Married    Spouse Name: N/A    Number of Children: N/A  . Years of Education: N/A   Occupational History  . Not on file.   Social History Main Topics  . Smoking status: Never Smoker   . Smokeless tobacco: Never Used  . Alcohol Use: No  . Drug Use: No  . Sexually Active: Yes -- Female partner(s)     With husband   Other Topics Concern  . Not on file   Social History Narrative  . No narrative on file   Health Maintenance  Topic Date Due  . Pap Smear  02/04/1987  . Ophthalmology Exam  07/24/2012  . Urine Microalbumin  09/01/2012  . Hemoglobin A1c  12/22/2012  . Mammogram  04/14/2013  . Influenza Vaccine  05/07/2013  . Foot Exam  06/23/2013  . Tetanus/tdap  09/06/2013  . Colonoscopy  04/17/2022    The following portions of the patient's history were reviewed and updated as appropriate: allergies, current medications, past family history, past medical history, past social history, past surgical history and problem list.  Review of Systems A comprehensive review of systems was negative.   Objective:    BP 150/72  Pulse 94  Ht 5' 2.25" (1.581 m)  Wt 225 lb (102.059 kg)  BMI 40.82 kg/m2  SpO2 94% General appearance: alert,  cooperative, appears stated age and moderately obese Head: Normocephalic, without obvious abnormality, atraumatic thining of hair on the top of the head. Eyes: conjunctivae/corneas clear. PERRL, EOM's intact. Fundi benign. Ears: normal TM's and external ear canals both ears Nose: Nares normal. Septum midline. Mucosa normal. No drainage or sinus tenderness. Throat: lips, mucosa, and tongue normal; teeth and gums normal Neck: no adenopathy, no carotid bruit, no JVD, supple, symmetrical, trachea midline and thyroid not enlarged, symmetric, no tenderness/mass/nodules Back: symmetric, no curvature. ROM normal. No CVA tenderness. Lungs: clear to auscultation bilaterally Breasts: normal appearance, no masses or tenderness Heart: regular rate and rhythm, S1, S2 normal, no murmur, click, rub or gallop Abdomen: soft, non-tender; bowel sounds normal; no masses,  no organomegaly Extremities: extremities normal, atraumatic, no cyanosis or edemaReflexes 3+ bilateral knee reflex. Pulses: 2+ and symmetric Skin: Skin color, texture, turgor normal. No rashes or lesions Lymph nodes: Cervical, supraclavicular, and axillary nodes normal. Neurologic: Grossly normal    Assessment:    Healthy female exam.      Plan:    Diabetes type II/CPE- A1C was 10.5. Checked BMP today. Vaccines are up to date.Flu shot given today. Stopped 70/30 insulin. Started meal time insulin. Gave sliding scale  chart. Lantus can increase by 3 units every 3 days until gets to 120 then increase by 1 unit every 3 days until at 100. Encouraged diabetic diet and regular exercise. Will refer to nutritionist. Gave portion control container to take home today. Filled out paperwork for new job. Encouraged calcium 4 servings or 500mg  twice a day. Will also check TSH due to hyperreflexia and alopecia.Follow up in 3 months.   Diabetic foot exam:  Left: Reflexes 2+   Vibratory sensation normal  Proprioception normal  Sharp/dull discrimination  normal  Filament test present Right: Reflexes 2+   Vibratory sensation normal  Proprioception normal  Sharp/dull discrimination normal  Filament test present  She was reminded to get her yearly eye doc appt.    See After Visit Summary for Counseling Recommendations

## 2012-06-26 ENCOUNTER — Other Ambulatory Visit: Payer: Self-pay | Admitting: *Deleted

## 2012-06-26 MED ORDER — INSULIN ASPART 100 UNIT/ML ~~LOC~~ SOLN: SUBCUTANEOUS | Status: DC

## 2012-06-29 ENCOUNTER — Telehealth: Payer: Self-pay | Admitting: *Deleted

## 2012-06-29 NOTE — Telephone Encounter (Signed)
Pt calls and wanted to know if the insulin you gave her came in the pens rather than vials. Would like pens sent in if available. Please advise

## 2012-06-29 NOTE — Telephone Encounter (Signed)
Pt calls and wants to know if the insulin you gave her comes in pens rather than vials. Would like the pens sent to pharmacy if available

## 2012-06-30 ENCOUNTER — Other Ambulatory Visit: Payer: Self-pay | Admitting: *Deleted

## 2012-06-30 MED ORDER — AMBULATORY NON FORMULARY MEDICATION: Status: AC

## 2012-06-30 MED ORDER — INSULIN ASPART 100 UNIT/ML ~~LOC~~ SOLN: SUBCUTANEOUS | Status: DC

## 2012-06-30 NOTE — Telephone Encounter (Signed)
Pt.notified

## 2012-06-30 NOTE — Telephone Encounter (Signed)
Sent pens over.

## 2012-07-29 ENCOUNTER — Other Ambulatory Visit (HOSPITAL_COMMUNITY): Payer: Self-pay | Admitting: Physician Assistant

## 2012-08-01 ENCOUNTER — Ambulatory Visit: Payer: Self-pay | Admitting: *Deleted

## 2012-09-03 ENCOUNTER — Other Ambulatory Visit: Payer: Self-pay | Admitting: Family Medicine

## 2012-09-04 ENCOUNTER — Other Ambulatory Visit: Payer: Self-pay | Admitting: *Deleted

## 2012-09-04 MED ORDER — INSULIN ASPART 100 UNIT/ML ~~LOC~~ SOLN: SUBCUTANEOUS | Status: DC

## 2012-09-05 ENCOUNTER — Encounter (HOSPITAL_COMMUNITY): Payer: Self-pay | Admitting: Psychology

## 2012-09-05 DIAGNOSIS — F411 Generalized anxiety disorder: Secondary | ICD-10-CM

## 2012-09-05 DIAGNOSIS — F331 Major depressive disorder, recurrent, moderate: Secondary | ICD-10-CM

## 2012-09-05 NOTE — Progress Notes (Signed)
Patient ID: Jennifer Conrad, female   DOB: 02-17-1961, 51 y.o.   MRN: [id number]  Outpatient Therapist Discharge Summary     Discharge Date:  09/05/2012 Reason for Discharge:  No contact in more than 180 days. Diagnosis:  Axis I:   1. Generalized anxiety disorder   2. Major depressive disorder, recurrent episode, moderate     Axis II:  None  Axis III:  Allergic rhinitis, vertigo, diabetes mellitus type II, HTN, migraine, obstructive sleep apnea    Comments:  Resume therapy as requested.  Salley Scarlet

## 2012-09-22 ENCOUNTER — Encounter: Payer: Self-pay | Admitting: Physician Assistant

## 2012-09-22 ENCOUNTER — Ambulatory Visit: Payer: Self-pay | Admitting: Physician Assistant

## 2012-09-22 ENCOUNTER — Ambulatory Visit (INDEPENDENT_AMBULATORY_CARE_PROVIDER_SITE_OTHER): Payer: PRIVATE HEALTH INSURANCE | Admitting: Physician Assistant

## 2012-09-22 VITALS — BP 114/83 | HR 77 | Wt 214.0 lb

## 2012-09-22 DIAGNOSIS — F333 Major depressive disorder, recurrent, severe with psychotic symptoms: Secondary | ICD-10-CM

## 2012-09-22 DIAGNOSIS — E119 Type 2 diabetes mellitus without complications: Secondary | ICD-10-CM

## 2012-09-22 DIAGNOSIS — IMO0001 Reserved for inherently not codable concepts without codable children: Secondary | ICD-10-CM

## 2012-09-22 NOTE — Progress Notes (Signed)
  Subjective:    Patient ID: Jennifer Conrad, female    DOB: Mar 24, 1961, 52 y.o.   MRN: [id number]  HPI Patient comes in to follow up on diabetes and depression.   She is well aware diabetes is out of control. She stopped taking anti-depressants because she was doing so well. She went into a major depression. She didn't leave room for weeks. She stopped taking insulin and ate whatever she wanted. She stopped going to church, work, talking to friends. She admits that she had suicidal thoughts and formulated a plan. She denies any situation that caused the exacerbation. She reports that her life is great. Great husband, kids, friends. She did finally call psychologist and started back on depression medication as well as insulin about 3 weeks ago. She does feel much better. She now knows she needs to stay on anti-depressants. She is making a plan to exercise regularly and she has started back to work. She gets out now and feels much better. She denies any vision changes, feet pain, CP, palpitations, HA's. So far she has not had any complications of diabetes.   Review of Systems     Objective:   Physical Exam  Constitutional: She is oriented to person, place, and time. She appears well-developed and well-nourished.  HENT:  Head: Normocephalic and atraumatic.  Cardiovascular: Normal rate, regular rhythm and normal heart sounds.   Pulmonary/Chest: Effort normal and breath sounds normal.  Neurological: She is alert and oriented to person, place, and time.  Skin: Skin is warm and dry.  Psychiatric: She has a normal mood and affect. Her behavior is normal.          Assessment & Plan:  Diabetes type II, uncontrolled- A1C was 11.5.Pt aware that number is very out of controlled. Discussed what can happen once A1C stays out of control. She is on board and now that depression is getting better she is ready to tackle sugar control. Taking 37 units at night Lantus and adjusting every 2 nights pending  fasting sugar. Starting back on sliding scale at meal time. We start to watch diet and check sugars regularly. Want to see diary of sugars in 4 weeks. Will not check A1C for 3 months. Would like to check a BmP today    Depression- Patient recently started back medications. I do not want to change anything because she was controlled before. Keep on same doses but change when you take them. Take wellbutrin in the morning and lexapro at night. She is on max doses. Reminded pt that it does take time to build back up in system. Give another 4 weeks and th lets see if you feel less depressed and more stable. Encouraged regular exercise that eill also help. Discussed what to do if have suicidal thoughts anymore or feels like meds not working. Keep regular appt with psychcologist.

## 2012-09-22 NOTE — Patient Instructions (Signed)
Go back on lantus and Novolog regularly. Use sliding scale insulin. Watch control.   Start Wellbutin in the morning and lexapro at night. Follow up 4 weeks.   Make it a point to exercise at least 3-4 times a week.

## 2012-10-02 ENCOUNTER — Encounter: Payer: Self-pay | Admitting: Physician Assistant

## 2012-10-02 ENCOUNTER — Ambulatory Visit (INDEPENDENT_AMBULATORY_CARE_PROVIDER_SITE_OTHER): Payer: PRIVATE HEALTH INSURANCE | Admitting: Physician Assistant

## 2012-10-02 VITALS — BP 143/82 | HR 64 | Temp 98.3°F | Resp 18 | Wt 213.0 lb

## 2012-10-02 DIAGNOSIS — R1013 Epigastric pain: Secondary | ICD-10-CM

## 2012-10-02 DIAGNOSIS — Z8619 Personal history of other infectious and parasitic diseases: Secondary | ICD-10-CM

## 2012-10-02 LAB — POCT HEMOGLOBIN: Hemoglobin: 14.4 g/dL (ref 12.2–16.2)

## 2012-10-02 NOTE — Progress Notes (Signed)
  Subjective:    Patient ID: Jennifer Conrad, female    DOB: 01/03/1961, 52 y.o.   MRN: [id number]  HPI Patient present to clinic with worsening epigastric pain. Discomfort started 4 days ago with burning in upper stomach area. Pt has been treated for h.pylori twice. She has never had endoscopy. She is not taking protonix but did start taking carafate and has helped the last 2 days. She admits to drinking 4-6 diet pepsi a day. She feels very tired. She has had 2 bowel movements that were black and tarry. She has had a little nausea but no vomiting. Denies any fever chills. She has had some shortness of breath she feels like due to pain with deep breathing.   Review of Systems     Objective:   Physical Exam  Constitutional: She is oriented to person, place, and time. She appears well-developed and well-nourished.  HENT:  Head: Normocephalic and atraumatic.  Cardiovascular: Normal rate, regular rhythm and normal heart sounds.   Pulmonary/Chest: Effort normal and breath sounds normal. She has no wheezes.  Abdominal: Soft. Bowel sounds are normal.       Epigastric tenderness to palpation as well as left upper quadrant tenderness.  Neurological: She is alert and oriented to person, place, and time.  Skin: Skin is warm and dry.  Psychiatric: She has a normal mood and affect. Her behavior is normal.          Assessment & Plan:  Epigastric pain/history of h/pylori- hemoglobin 14.2 which is reassuring for no blood loss. dicussed with patient that should be immune to h.pylori and not have active infection again. Start back on protonix and continue on carafate. Reeducated patient to stay on protonix even when feeling better. Will refer to gastroenterology for endoscopy.

## 2012-10-02 NOTE — Patient Instructions (Addendum)
Will refer for endoscopy.   IN meantime, start back up on protonix and carafate. Limit foods that cause excess acid production.

## 2012-10-19 ENCOUNTER — Other Ambulatory Visit (HOSPITAL_COMMUNITY): Payer: Self-pay | Admitting: Family Medicine

## 2012-10-20 ENCOUNTER — Ambulatory Visit: Payer: Self-pay | Admitting: Physician Assistant

## 2012-10-20 DIAGNOSIS — Z0289 Encounter for other administrative examinations: Secondary | ICD-10-CM

## 2012-10-25 ENCOUNTER — Other Ambulatory Visit: Payer: Self-pay | Admitting: Family Medicine

## 2012-10-31 ENCOUNTER — Other Ambulatory Visit: Payer: Self-pay | Admitting: Physician Assistant

## 2012-12-08 ENCOUNTER — Telehealth: Payer: Self-pay | Admitting: Physician Assistant

## 2012-12-08 NOTE — Telephone Encounter (Signed)
Have a note in system that you need follow up diagnostic mammogram after 6 months which would be now. Have you had done?

## 2012-12-08 NOTE — Telephone Encounter (Signed)
LMOM for pt to return my call.

## 2012-12-08 NOTE — Telephone Encounter (Signed)
Message copied by Jomarie Longs on Fri Dec 08, 2012 12:12 PM ------      Message from: Jomarie Longs      Created: Fri Apr 14, 2012  8:46 AM       Diagnostic mammogram in 6 months. ------

## 2012-12-08 NOTE — Telephone Encounter (Signed)
Patient states she has one scheduled for next Thursday.

## 2012-12-26 ENCOUNTER — Other Ambulatory Visit: Payer: Self-pay | Admitting: Family Medicine

## 2013-02-01 ENCOUNTER — Other Ambulatory Visit: Payer: Self-pay | Admitting: Family Medicine

## 2013-02-07 ENCOUNTER — Other Ambulatory Visit (HOSPITAL_COMMUNITY): Payer: Self-pay | Admitting: Family Medicine

## 2013-02-17 ENCOUNTER — Other Ambulatory Visit: Payer: Self-pay | Admitting: Family Medicine

## 2013-02-19 ENCOUNTER — Emergency Department (INDEPENDENT_AMBULATORY_CARE_PROVIDER_SITE_OTHER): Payer: PRIVATE HEALTH INSURANCE

## 2013-02-19 ENCOUNTER — Encounter: Payer: Self-pay | Admitting: *Deleted

## 2013-02-19 ENCOUNTER — Emergency Department
Admission: EM | Admit: 2013-02-19 | Discharge: 2013-02-19 | Disposition: A | Discharge status: Home / Self Care | Payer: PRIVATE HEALTH INSURANCE | Source: Home / Self Care | Attending: Family Medicine | Admitting: Family Medicine

## 2013-02-19 DIAGNOSIS — R079 Chest pain, unspecified: Secondary | ICD-10-CM

## 2013-02-19 DIAGNOSIS — M940 Chondrocostal junction syndrome [Tietze]: Secondary | ICD-10-CM

## 2013-02-19 DIAGNOSIS — S20211A Contusion of right front wall of thorax, initial encounter: Secondary | ICD-10-CM

## 2013-02-19 MED ORDER — TRAMADOL HCL 50 MG PO TABS: 50.0000 mg | ORAL_TABLET | Freq: Four times a day (QID) | ORAL | Status: DC | PRN

## 2013-02-19 NOTE — ED Provider Notes (Signed)
History     CSN: [id number]  Arrival date & time 02/19/13  1221   First MD Initiated Contact with Patient 02/19/13 1227      Chief Complaint  Patient presents with  . Abdominal Pain   HPI  R sided rib pain x 1 week  Pt states that she has had R rib pain and pain with deep breathing.  No fevers or chills.  Pt states that she leaned over on a washing machine, placing all of her body weight  Developed severe R sided pain since this point No rashes.  Pain mildly radiates to RUQ No nausea. Baseline hx/o GB disease s/p cholecystectomy as well as GERD, h pylori, stomach ulcers.  Has been taking IBF for pain.    Past Medical History  Diagnosis Date  . Obesity (BMI 30-39.9)     alowly losing weight/smaller portions/exercise 3 X week  . Depression   . Diabetes mellitus   . Hypertension     Past Surgical History  Procedure Laterality Date  . Abdominal hysterectomy    . Cholecystectomy    . Carpal tunnel release  10/2011    Family History  Problem Relation Age of Onset  . Depression Sister   . Drug abuse Cousin   . Depression Sister   . Diabetes Mother   . Cancer Mother   . Cancer Father     History  Substance Use Topics  . Smoking status: Never Smoker   . Smokeless tobacco: Never Used  . Alcohol Use: No    OB History   Grav Para Term Preterm Abortions TAB SAB Ect Mult Living                  Review of Systems  All other systems reviewed and are negative.    Allergies  Review of patient's allergies indicates no known allergies.  Home Medications   Current Outpatient Rx  Name  Route  Sig  Dispense  Refill  . AMBULATORY NON FORMULARY MEDICATION      Medication Name: One touch ultra mini strips  To test blood sugar three times a day.   100 strip   11   . amLODipine-benazepril (LOTREL) 10-40 MG per capsule      TAKE ONE CAPSULE BY MOUTH DAILY   30 capsule   0     PT MUST MAKE APPOINTMENT BEFORE ANY FURTHER REFILL .Marland Kitchen.   . atenolol (TENORMIN)  25 MG tablet      TAKE 1 TABLET (25 MG TOTAL) BY MOUTH DAILY.   30 tablet   0     PT MUST MAKE APPOINTMENT BEFORE ANY FURTHER REFILL .Marland Kitchen.   . buPROPion (WELLBUTRIN XL) 150 MG 24 hr tablet      TAKE 2 TABLETS (300 MG TOTAL) BY MOUTH EVERY MORNING.   60 tablet   2   . escitalopram (LEXAPRO) 20 MG tablet      TAKE 1 TABLET (20 MG TOTAL) BY MOUTH DAILY.   30 tablet   2   . hydrochlorothiazide (HYDRODIURIL) 25 MG tablet      TAKE 1 TABLET BY MOUTH EVERY DAY   30 tablet   0     PT MUST MAKE APPOINTMENT BEFORE ANY FURTHER REFILL .Marland Kitchen.   . insulin aspart (NOVOLOG) 100 UNIT/ML injection      Inject SS before each meal         . insulin glargine (LANTUS) 100 UNIT/ML injection   Subcutaneous   Inject 42 Units into  the skin at bedtime.         . Insulin Pen Needle (PEN NEEDLES 3/16") 31G X 5 MM MISC   Does not apply   1 Units by Does not apply route daily.   100 each   1   . metFORMIN (GLUCOPHAGE) 1000 MG tablet   Oral   Take 1,000 mg by mouth 2 (two) times daily with a meal.          . oxybutynin (DITROPAN-XL) 10 MG 24 hr tablet      TAKE 1 TABLET (10 MG TOTAL) BY MOUTH DAILY.   30 tablet   0   . pantoprazole (PROTONIX) 40 MG tablet      TAKE 1 TABLET (40 MG TOTAL) BY MOUTH DAILY.   30 tablet   0     PT MUST MAKE APPOINTMENT BEFORE ANY FURTHER REFILL .Marland Kitchen.   . sucralfate (CARAFATE) 1 G tablet   Oral   Take 1 tablet (1 g total) by mouth 4 (four) times daily.   60 tablet   1   . triamcinolone cream (KENALOG) 0.5 %                 BP 138/90  Pulse 74  Temp(Src) 98.5 F (36.9 C) (Oral)  Resp 18  Ht 5\' 2"  (1.575 m)  Wt 211 lb (95.709 kg)  BMI 38.58 kg/m2  SpO2 90%  Physical Exam  Constitutional: She appears well-developed and well-nourished.  HENT:  Head: Normocephalic and atraumatic.  Eyes: Conjunctivae are normal. Pupils are equal, round, and reactive to light.  Neck: Normal range of motion.  Cardiovascular: Normal rate and regular rhythm.    Pulmonary/Chest:    + TTP  No rash   + pain with deep breathing      ED Course  Procedures (including critical care time)  Labs Reviewed - No data to display Dg Ribs Unilateral W/chest Right  02/19/2013   *RADIOLOGY REPORT*  Clinical Data: Right rib pain  RIGHT RIBS AND CHEST - 3+ VIEW  Comparison: None.  Findings: Three views right ribs submitted.  No acute infiltrate or pulmonary edema.  No right rib fracture is identified.  No diagnostic pneumothorax.  IMPRESSION: No right rib fracture.  No diagnostic pneumothorax.   Original Report Authenticated By: Natasha Mead, M.D.     1. Costochondritis       MDM  Xrays negative for fracture, PTX, PNA Will place on tramadol and tylenol for pain (avoid NSAIDs in setting of hx/o stomach ulcers) Given hx/o GI disease will also obtain RUQ U/S and labwork (CMET, lipase) to rule out internal causes.  I/S for air recruitment Discussed MSK and systemic red flags.  Follow up as needed.     The patient and/or caregiver has been counseled thoroughly with regard to treatment plan and/or medications prescribed including dosage, schedule, interactions, rationale for use, and possible side effects and they verbalize understanding. Diagnoses and expected course of recovery discussed and will return if not improved as expected or if the condition worsens. Patient and/or caregiver verbalized understanding.             Doree Albee, MD 02/19/13 [phone]

## 2013-02-19 NOTE — ED Notes (Signed)
Lissette c/o gradual onset RUQ abdominal pain x 4 days. Pain is now constant and sharp. No changes on bowels or appetite. IBF and heat with relief.

## 2013-02-20 ENCOUNTER — Ambulatory Visit (INDEPENDENT_AMBULATORY_CARE_PROVIDER_SITE_OTHER): Payer: PRIVATE HEALTH INSURANCE

## 2013-02-20 DIAGNOSIS — K7689 Other specified diseases of liver: Secondary | ICD-10-CM

## 2013-02-20 DIAGNOSIS — R160 Hepatomegaly, not elsewhere classified: Secondary | ICD-10-CM

## 2013-02-20 LAB — COMPREHENSIVE METABOLIC PANEL
Albumin: 3.7 g/dL (ref 3.5–5.2)
Alkaline Phosphatase: 87 U/L (ref 39–117)
CO2: 28 mEq/L (ref 19–32)
Calcium: 9 mg/dL (ref 8.4–10.5)
Chloride: 98 mEq/L (ref 96–112)
Glucose, Bld: 330 mg/dL — ABNORMAL HIGH (ref 70–99)
Potassium: 4.5 mEq/L (ref 3.5–5.3)
Sodium: 137 mEq/L (ref 135–145)
Total Protein: 7.3 g/dL (ref 6.0–8.3)

## 2013-02-20 LAB — CBC WITH DIFFERENTIAL/PLATELET
HCT: 44.6 % (ref 36.0–46.0)
Hemoglobin: 14.4 g/dL (ref 12.0–15.0)
Lymphs Abs: 2.5 10*3/uL (ref 0.7–4.0)
MCH: 25.8 pg — ABNORMAL LOW (ref 26.0–34.0)
MCHC: 32.3 g/dL (ref 30.0–36.0)
Monocytes Absolute: 0.6 10*3/uL (ref 0.1–1.0)
Monocytes Relative: 5 % (ref 3–12)
Neutro Abs: 7.1 10*3/uL (ref 1.7–7.7)
Neutrophils Relative %: 68 % (ref 43–77)
RBC: 5.58 MIL/uL — ABNORMAL HIGH (ref 3.87–5.11)

## 2013-02-20 LAB — LIPASE: Lipase: 30 U/L (ref 0–75)

## 2013-02-20 IMAGING — US US ABDOMEN LIMITED
1 series · 14 of 25 positions shown · non-contrast
Comparison: none

Ultrasound right upper quadrant
HISTORY: Abdominal pain

[Series 1: us abdomen limited · 0.33mm/px · 14 of 32 slices shown]
[im 1/32]
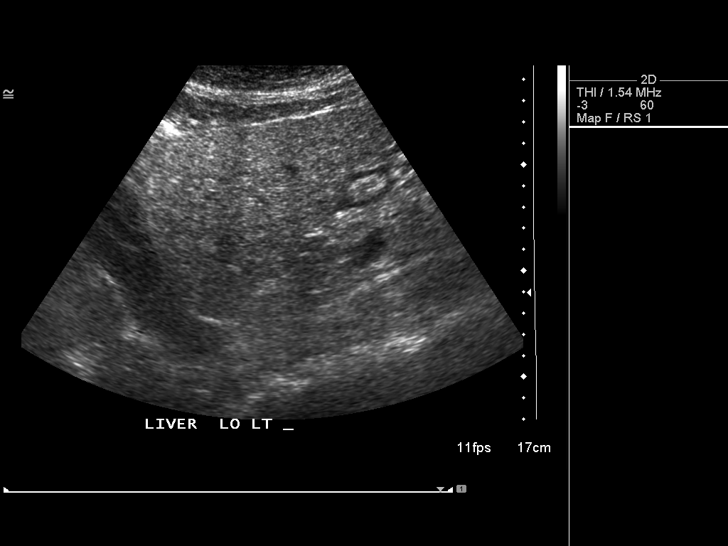
[im 3/32]
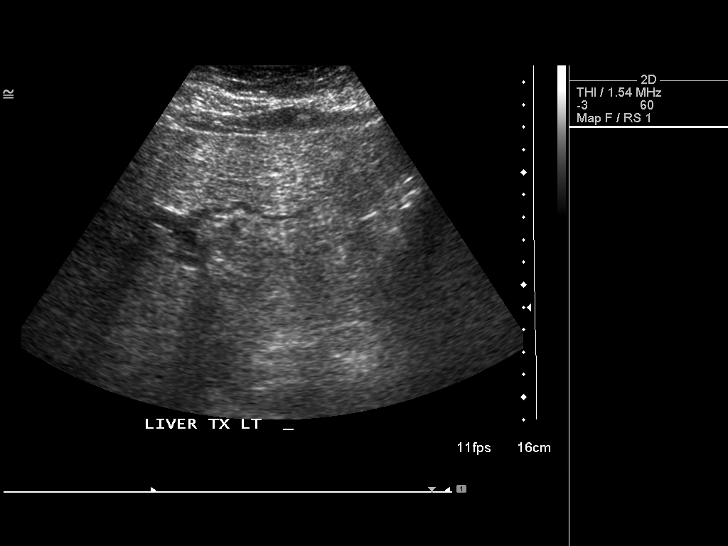
[im 6/32]
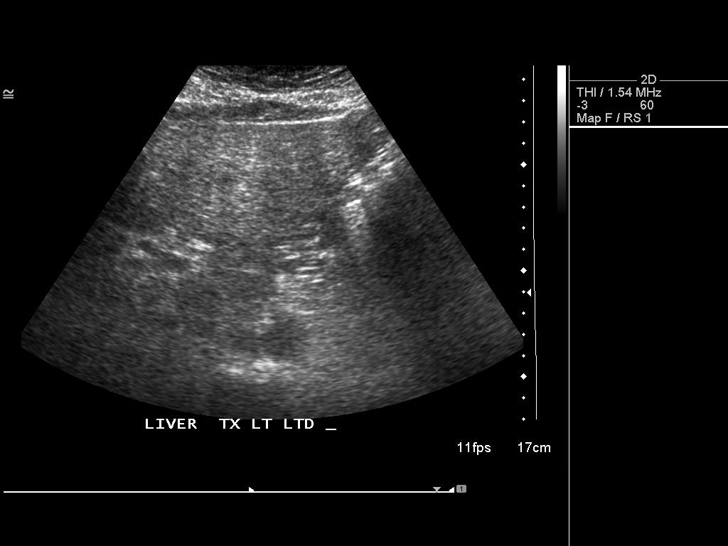
[im 8/32]
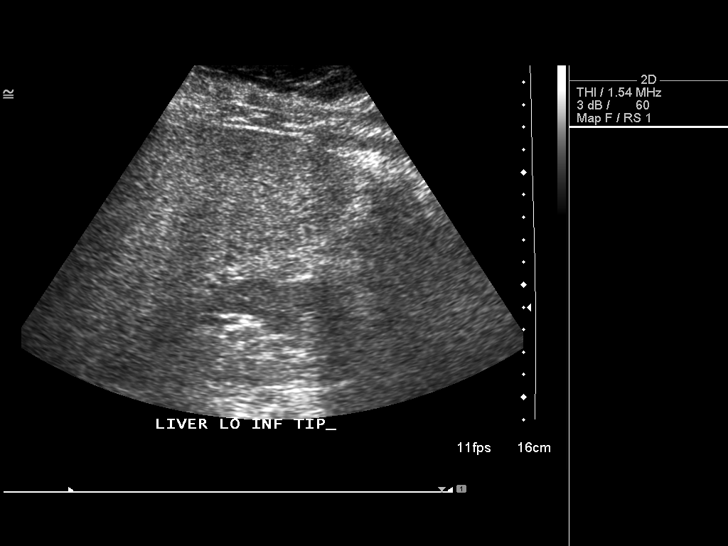
[im 11/32]
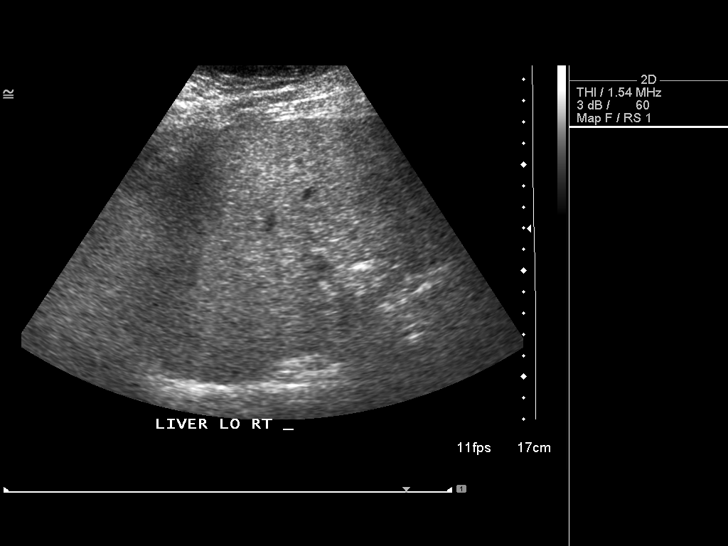
[im 12/32]
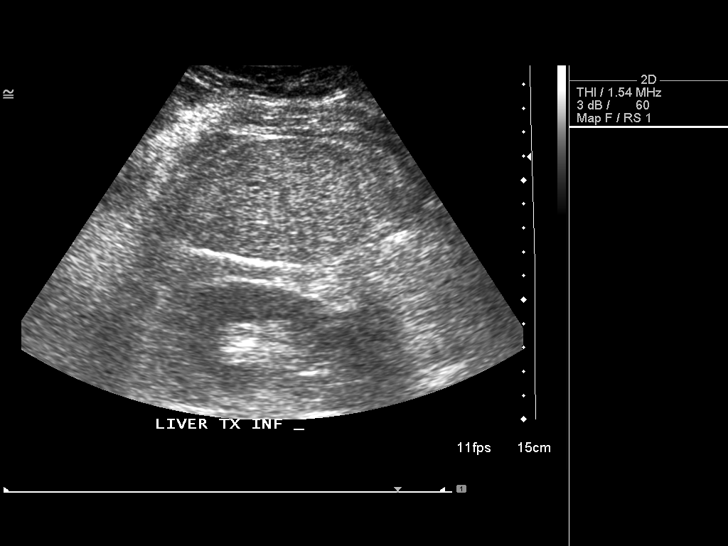
[im 15/32]
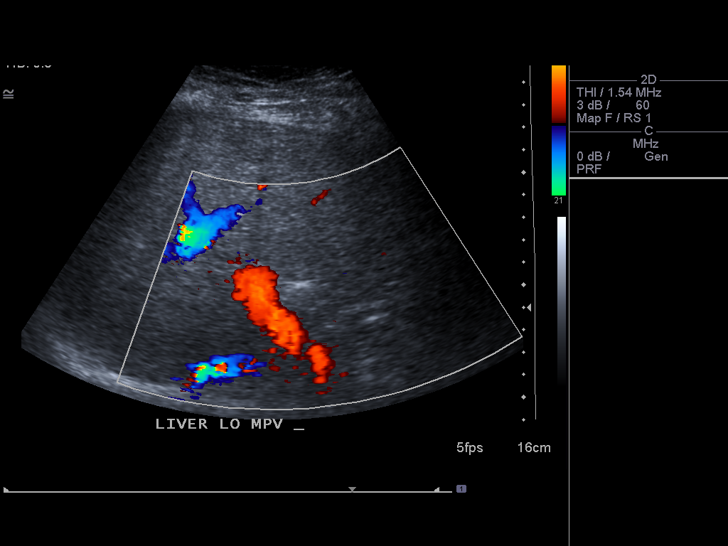
[im 17/32]
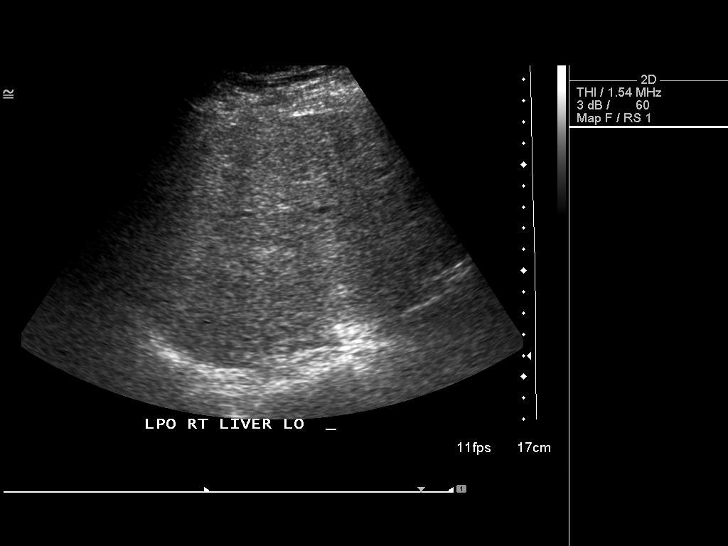
[im 20/32]
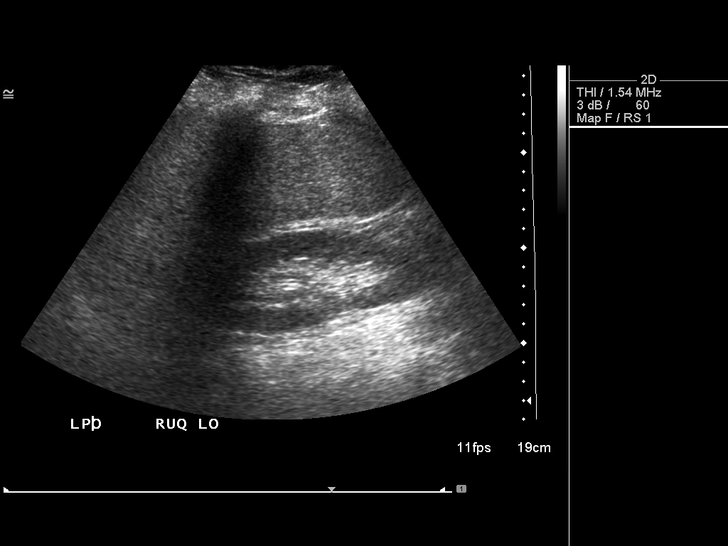
[im 21/32]
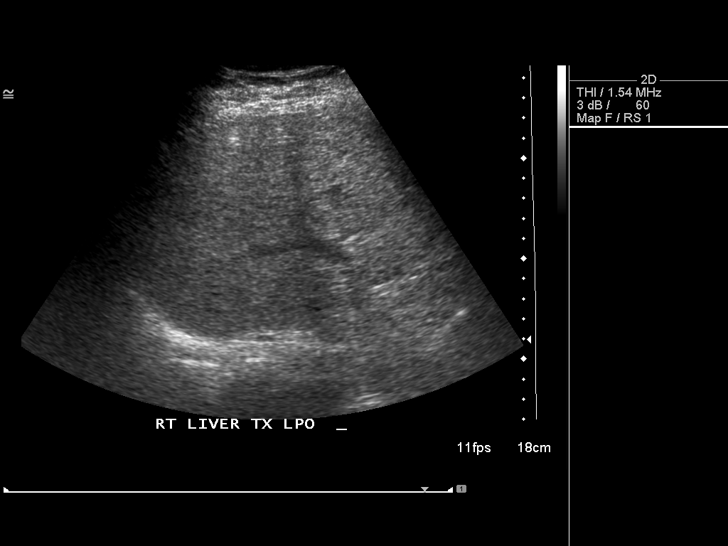
[im 24/32]
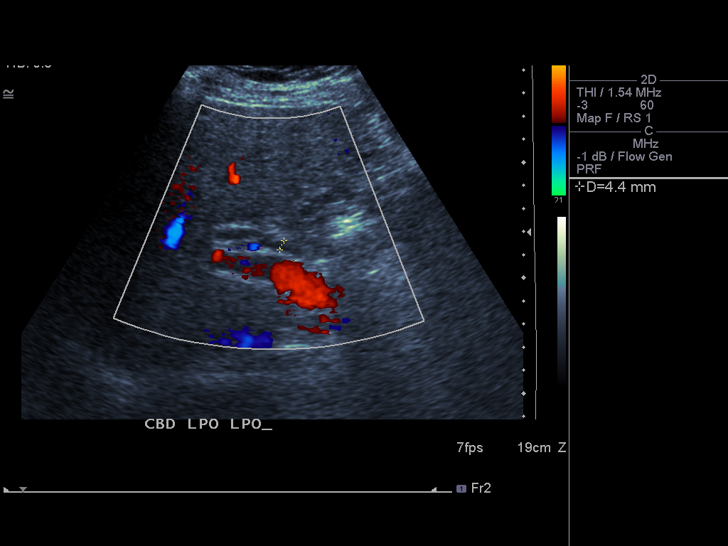
[im 26/32]
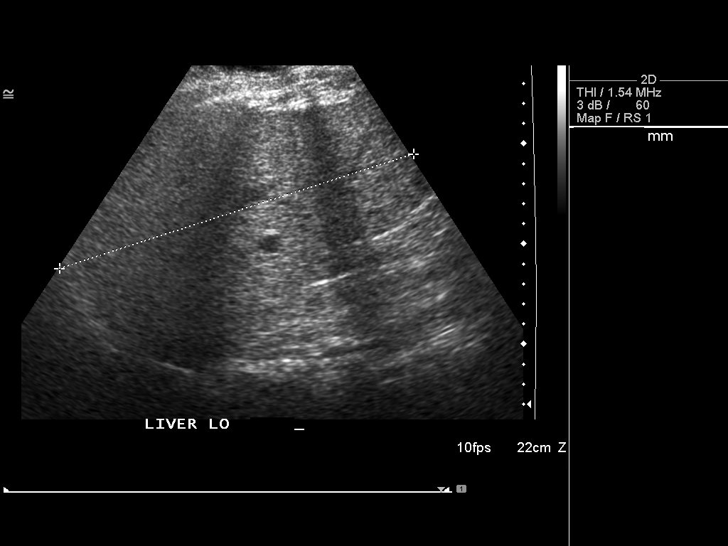
[im 29/32]
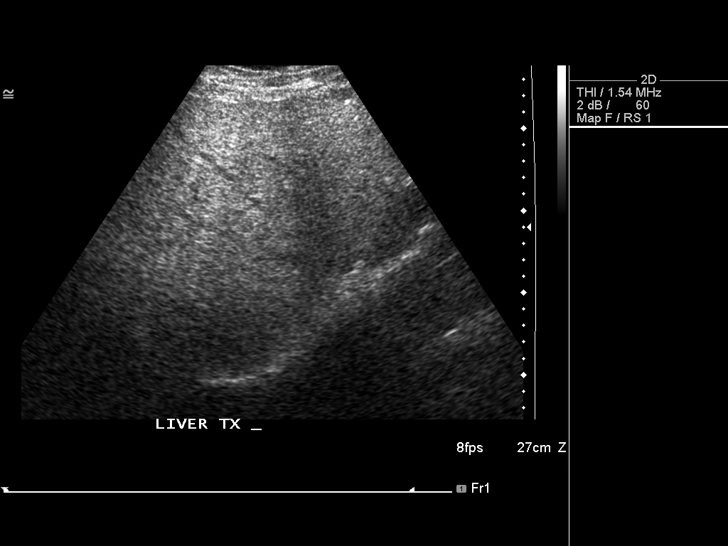
[im 32/32]
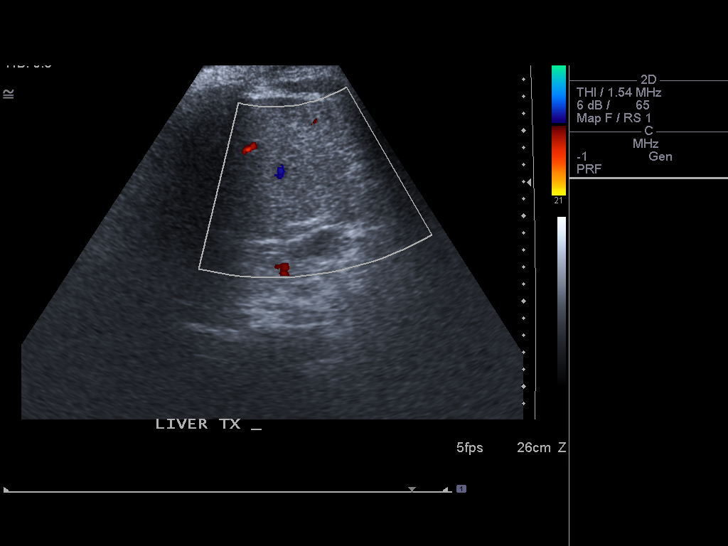

[14 of 25 positions shown; findings below may reference images not displayed]

FINDINGS: Gallbladder is surgically absent.  There is no
intrahepatic or extrahepatic biliary duct dilatation.

Liver is enlarged measuring 18.6 cm in length.  The liver shows
diffuse increased echogenicity.  The echotexture of the liver is
coarse.  No focal liver masses are appreciated.
CONCLUSION: Enlarged liver with underlying parenchymal disease and
fatty change.  While no focal liver lesions are identified, it must
be cautioned that the sensitivity of ultrasound for focal masses is
diminished significantly given this degree of underlying fatty
change and / or parenchymal disease.

Gallbladder is absent.  No biliary duct dilatation identified.

## 2013-02-22 ENCOUNTER — Telehealth: Payer: Self-pay | Admitting: *Deleted

## 2013-02-22 NOTE — Telephone Encounter (Signed)
Message copied by Edilia Bo on Thu Feb 22, 2013 11:08 AM ------      Message from: Floydene Flock      Created: Tue Feb 20, 2013  2:04 PM       Fatty liver disease on U/S with normal LFTs.       Follow up with PCP about diet and lifestyle modifications.        ------

## 2013-02-25 ENCOUNTER — Other Ambulatory Visit: Payer: Self-pay | Admitting: Physician Assistant

## 2013-03-05 ENCOUNTER — Other Ambulatory Visit: Payer: Self-pay | Admitting: Family Medicine

## 2013-03-05 ENCOUNTER — Other Ambulatory Visit: Payer: Self-pay | Admitting: Physician Assistant

## 2013-03-05 DIAGNOSIS — N6002 Solitary cyst of left breast: Secondary | ICD-10-CM

## 2013-03-15 ENCOUNTER — Other Ambulatory Visit: Payer: Self-pay

## 2013-03-22 ENCOUNTER — Other Ambulatory Visit: Payer: Self-pay

## 2013-04-09 ENCOUNTER — Other Ambulatory Visit: Payer: Self-pay | Admitting: Family Medicine

## 2013-04-21 ENCOUNTER — Other Ambulatory Visit: Payer: Self-pay | Admitting: Family Medicine

## 2013-05-01 ENCOUNTER — Other Ambulatory Visit: Payer: Self-pay | Admitting: Family Medicine

## 2013-05-13 ENCOUNTER — Other Ambulatory Visit (HOSPITAL_COMMUNITY): Payer: Self-pay | Admitting: Family Medicine

## 2013-05-23 ENCOUNTER — Other Ambulatory Visit: Payer: Self-pay | Admitting: Physician Assistant

## 2013-05-23 ENCOUNTER — Other Ambulatory Visit: Payer: Self-pay | Admitting: Family Medicine

## 2013-05-30 ENCOUNTER — Encounter: Payer: Self-pay | Admitting: Physician Assistant

## 2013-05-30 ENCOUNTER — Ambulatory Visit (INDEPENDENT_AMBULATORY_CARE_PROVIDER_SITE_OTHER): Payer: PRIVATE HEALTH INSURANCE | Admitting: Physician Assistant

## 2013-05-30 VITALS — BP 176/94 | HR 91 | Wt 210.0 lb

## 2013-05-30 DIAGNOSIS — IMO0001 Reserved for inherently not codable concepts without codable children: Secondary | ICD-10-CM

## 2013-05-30 DIAGNOSIS — IMO0002 Reserved for concepts with insufficient information to code with codable children: Secondary | ICD-10-CM

## 2013-05-30 DIAGNOSIS — E1165 Type 2 diabetes mellitus with hyperglycemia: Secondary | ICD-10-CM

## 2013-05-30 DIAGNOSIS — Z23 Encounter for immunization: Secondary | ICD-10-CM

## 2013-05-30 DIAGNOSIS — I10 Essential (primary) hypertension: Secondary | ICD-10-CM

## 2013-05-30 DIAGNOSIS — F341 Dysthymic disorder: Secondary | ICD-10-CM

## 2013-05-30 DIAGNOSIS — G8929 Other chronic pain: Secondary | ICD-10-CM

## 2013-05-30 DIAGNOSIS — R1013 Epigastric pain: Secondary | ICD-10-CM

## 2013-05-30 LAB — POCT GLYCOSYLATED HEMOGLOBIN (HGB A1C): Hemoglobin A1C: 10.5

## 2013-05-30 MED ORDER — SUCRALFATE 1 G PO TABS: 1.0000 g | ORAL_TABLET | Freq: Four times a day (QID) | ORAL | Status: DC

## 2013-05-30 NOTE — Progress Notes (Signed)
Subjective:    Patient ID: Jennifer Conrad, female    DOB: 06/16/1961, 52 y.o.   MRN: [id number]  HPI Patient is a 52 year old female who presents to the clinic to followup on ongoing conditions.  Diabetes mellitus uncontrolled- patient admits to not taking her medications as directed. She remembers her nightly dose of Lantus but cannot remember her meal time insulin. She does take 47 units of Lantus before bed. She often forgets her metformin as well. When patient checks her fasting glucose in the morning it is usually 120-180. She does not check often but when she checks her mealtime and swollen it can even range to 300-400. She denies any hypoglycemic episodes. She does have a lot of vision blurred.    Hypertension-today patient's blood pressure is not controlled. Patient reports she feels very overwhelmed and stressed. Patient has been taking her blood pressures at home and she reports they have been under 140/90 and controlled. She denies any chest pains, palpitations, shortness of breath.  Patient continues to have epigastric pain. She reports the pain has been ongoing since January. Eating initially relieves the pain but after 30 minutes to an hour after eating the pain is worse in back again. She does admit to drinking 3-4 Pepsi's a day. She is taking her protonix daily. She has not had any Carafate. When she was taking Carafate it did help. She has had H. pylori in the past and was treated. Symptoms feel very similar. Patient denies any stool changes or presence of blood. Patient is not had any vomiting.    Review of Systems     Objective:   Physical Exam  Constitutional: She is oriented to person, place, and time. She appears well-developed and well-nourished.  HENT:  Head: Normocephalic and atraumatic.  Neck: Normal range of motion. Neck supple. No thyromegaly present.  Cardiovascular: Normal rate, regular rhythm and normal heart sounds.   Pulmonary/Chest: Effort normal and  breath sounds normal. She has no wheezes.  Abdominal: Soft. Bowel sounds are normal.  Tenderness over epigastric are as well as LUQ.   Neurological: She is alert and oriented to person, place, and time.  Skin: Skin is warm and dry.  Psychiatric: She has a normal mood and affect. Her behavior is normal.          Assessment & Plan:  Diabetes Uncontrolled- A1C was 10.5. It has decreased by 1 point. Pt is not compliant with meal time insulin. She states she forgets. She sets alarms etc but still forgets multiple doses of meal time insulin a day. She is up to 47 units of bedtime insulin. I really think pt would be a great candidate for VGO. I am calling rep to help me through the process of prescribing my first time. Pt is aware she may need to come in to get started. VGO would allow her not to have to prepare any needles. At this point we need to get her A1C down. In meantime pt encouraged to try to remember insulin. Will check renal function today.  HTN- Pt checks at home and controlled. Will recheck in 2 weeks and make sure stable.   Epigastric pain- I still feel like patient should get endoscopy. Per pt she could not afford at beginning of the year. She takes protonix regularly. I will give Carafate to also use. I have left msg with GI doctor to ask input on h.pylori and if could get infection again. Pain sounds very similar and PPI is not helping.  i also have a concern about pancreas. I will also consider a CT of abdomen if pain persist to rule out any mass.   Flu shot given without complication.   Spent 30 minutes with patient and greater than 50 percent of visit spent counseling pt regarding diabetes and need for control.

## 2013-05-30 NOTE — Patient Instructions (Addendum)
Will call you back once speak to VGO rep.  Will call GI.   Follow 2 weeks.

## 2013-05-31 LAB — COMPLETE METABOLIC PANEL WITH GFR
ALT: 29 U/L (ref 0–35)
CO2: 33 mEq/L — ABNORMAL HIGH (ref 19–32)
Creat: 0.61 mg/dL (ref 0.50–1.10)
GFR, Est African American: >89 mL/min
Total Bilirubin: 0.4 mg/dL (ref 0.3–1.2)

## 2013-05-31 LAB — LIPID PANEL
HDL: 43 mg/dL (ref >39)
LDL Cholesterol: 91 mg/dL (ref 0–99)
Triglycerides: 96 mg/dL (ref <150)

## 2013-06-08 ENCOUNTER — Other Ambulatory Visit: Payer: Self-pay | Admitting: Physician Assistant

## 2013-06-08 MED ORDER — INSULIN ASPART 100 UNIT/ML ~~LOC~~ SOLN: SUBCUTANEOUS | Status: DC

## 2013-06-08 MED ORDER — AMBULATORY NON FORMULARY MEDICATION: Status: DC

## 2013-06-08 NOTE — Progress Notes (Signed)
Pt sent logs of blood sugar: Day 1- 233pm Day 6- 237am once took before lunch and was 176.   Will increase basal rate to 30.

## 2013-06-11 ENCOUNTER — Telehealth: Payer: Self-pay

## 2013-06-11 NOTE — Telephone Encounter (Signed)
Patient called and left a message on nurse line asking for a return call.   Returned Call: Left message asking patient to call back.  

## 2013-06-12 ENCOUNTER — Other Ambulatory Visit: Payer: Self-pay | Admitting: Family Medicine

## 2013-06-13 ENCOUNTER — Ambulatory Visit (INDEPENDENT_AMBULATORY_CARE_PROVIDER_SITE_OTHER): Payer: PRIVATE HEALTH INSURANCE | Admitting: Physician Assistant

## 2013-06-13 ENCOUNTER — Encounter: Payer: Self-pay | Admitting: Physician Assistant

## 2013-06-13 VITALS — BP 143/79 | HR 76 | Wt 213.0 lb

## 2013-06-13 DIAGNOSIS — I10 Essential (primary) hypertension: Secondary | ICD-10-CM

## 2013-06-13 DIAGNOSIS — IMO0001 Reserved for inherently not codable concepts without codable children: Secondary | ICD-10-CM

## 2013-06-13 DIAGNOSIS — Z8619 Personal history of other infectious and parasitic diseases: Secondary | ICD-10-CM

## 2013-06-13 MED ORDER — AMBULATORY NON FORMULARY MEDICATION: Status: DC

## 2013-06-13 MED ORDER — OLMESARTAN-AMLODIPINE-HCTZ 40-10-25 MG PO TABS: ORAL_TABLET | ORAL | Status: DC

## 2013-06-13 NOTE — Progress Notes (Signed)
  Subjective:    Patient ID: Jennifer Conrad, female    DOB: 1961-07-29, 52 y.o.   MRN: [id number]  HPI Patient is a 52 year old female who presents to the clinic to followup on hypertension. Patient's blood pressure is better today; however not improved completely. Discussed with patient I would like to see under 140/90. Patient also discussed that she's been having a dry irritable cough. She had read something about an ACE inhibitor causing this and she thinks she is on her period she would like to see if she could change it to see if her cough go away. She denies any chest pains, palpitations or shortness of breath.  Patient was also started on VGO last week. There was a discrepancy of the pharmacy and she was unable to pick up disposable pump. She loves the delivery method. She feels like she is more in control of her diabetes. She feels like she's seen a significant drop and fasting glucose which has been below 200 and she feels like she's changing her diet to use less insulin. She is very happy with her result even if it is only been a week.  At last visit patient was having a lot of epigastric pain and does have a long history of H. pylori. I called GI and they said to make sure that his color was eradicated to do a urea breath test. Patient's symptoms have improved since stopping an herbal supplement of cinnamon and something else patient is unaware of. She continues to take protonic daily and Carafate as needed.   Review of Systems     Objective:   Physical Exam  Constitutional: She is oriented to person, place, and time. She appears well-developed and well-nourished.  HENT:  Head: Normocephalic and atraumatic.  Cardiovascular: Normal rate, regular rhythm and normal heart sounds.   Pulmonary/Chest: Effort normal and breath sounds normal. She has no wheezes.  Abdominal: Soft. Bowel sounds are normal. There is no tenderness.  Neurological: She is alert and oriented to person, place, and  time.  Skin: Skin is warm and dry.  Psychiatric: She has a normal mood and affect. Her behavior is normal.          Assessment & Plan:  Hypertension-since patient reported a cough we'll switch to an arc instead of ace inhibitor. We did start a combination medication today of Tribenzor. That would eliminate her Lotrel and HCTZ separately. She will continue on atenolol 25 mg daily. Will recheck blood pressure and 6 weeks. Gave car to decrease pain.  Diabetes type 2 uncontrolled-reprinted the prescription so that it did strike with pharmacy. Patient instructed to call any more problems. I am very pleased with her receptiveness to the and hopefully we can get her A1c significantly lower in the next 3 months.  History of H. Pylori/epigastric pain-I am glad to note that the perhaps the herbal supplement was causing epigastric pain. Will do a urea breath test today to confirm H. pylori eradication.

## 2013-06-13 NOTE — Patient Instructions (Signed)
Will switch to Tribenzor and eliminate Lotrel and HCTZ. Continue on Atenolol 25mg  once a day. Follow up in 3 months.

## 2013-07-09 ENCOUNTER — Other Ambulatory Visit: Payer: Self-pay | Admitting: Family Medicine

## 2013-07-18 ENCOUNTER — Other Ambulatory Visit: Payer: Self-pay | Admitting: Physician Assistant

## 2013-07-18 MED ORDER — INSULIN ASPART 100 UNIT/ML ~~LOC~~ SOLN: SUBCUTANEOUS | Status: DC

## 2013-08-01 ENCOUNTER — Encounter: Payer: Self-pay | Admitting: Physician Assistant

## 2013-08-01 ENCOUNTER — Ambulatory Visit (INDEPENDENT_AMBULATORY_CARE_PROVIDER_SITE_OTHER): Payer: PRIVATE HEALTH INSURANCE | Admitting: Physician Assistant

## 2013-08-01 VITALS — BP 156/98 | HR 81 | Wt 207.0 lb

## 2013-08-01 DIAGNOSIS — IMO0001 Reserved for inherently not codable concepts without codable children: Secondary | ICD-10-CM

## 2013-08-01 DIAGNOSIS — I10 Essential (primary) hypertension: Secondary | ICD-10-CM

## 2013-08-01 MED ORDER — ATENOLOL 25 MG PO TABS: 25.0000 mg | ORAL_TABLET | Freq: Two times a day (BID) | ORAL | Status: DC

## 2013-08-01 MED ORDER — AMBULATORY NON FORMULARY MEDICATION: Status: DC

## 2013-08-01 NOTE — Progress Notes (Addendum)
  Subjective:    Patient ID: Jennifer Conrad, female    DOB: [birth year], 52 y.o.   MRN: [id number]  HPI Patient presents to the clinic to follow up on HTN. BP not controlled today. Checking at home and running in thte 140's to 137's. Today 150's. Has forgotten to take medication for the last 2 days until this morning. Denies any CP, palpitations, Headaches, vision changes.    DM- Vgo 30 flow and 8 clicks every meal. She has had a few lows. She is aware it is when her diet is less than insulin she is taking. She does feel much better and has more energy. Fasting BS has been in the 130's.    Review of Systems     Objective:   Physical Exam  Constitutional: She is oriented to person, place, and time. She appears well-developed and well-nourished.  HENT:  Head: Normocephalic and atraumatic.  Cardiovascular: Normal rate, regular rhythm and normal heart sounds.   Pulmonary/Chest: Effort normal and breath sounds normal. She has no wheezes.  Neurological: She is alert and oriented to person, place, and time.  Skin: Skin is warm and dry.  Psychiatric: She has a normal mood and affect. Her behavior is normal.          Assessment & Plan:  HTN- BP not controlled. Increased atenolol to 25mg  BID. Continue to check once a day and keep a log. Will recheck in 1 month.   Diabetes- not time for A1C. Doing well with VGO. She is taking 8 clicks which is equvialent to 16 units of insulin at each meal. Counseled by that she really needs to make diet changes if using that much insulin. Work on that until next visit in one month. Will see what A1C is in 1 month.

## 2013-08-01 NOTE — Patient Instructions (Signed)
Increase atenolol to twice a day. Start checking BP's daily alternating time of day. Keep log and bring in. Encouraged pt to decreasing. Keep monitoring BG.

## 2013-08-06 ENCOUNTER — Other Ambulatory Visit: Payer: Self-pay | Admitting: Family Medicine

## 2013-08-14 ENCOUNTER — Telehealth: Payer: Self-pay | Admitting: *Deleted

## 2013-08-14 ENCOUNTER — Other Ambulatory Visit: Payer: Self-pay | Admitting: Physician Assistant

## 2013-08-14 MED ORDER — TRIAMCINOLONE ACETONIDE 0.5 % EX OINT: 1.0000 "application " | TOPICAL_OINTMENT | Freq: Two times a day (BID) | CUTANEOUS | Status: DC

## 2013-08-14 NOTE — Telephone Encounter (Signed)
Ok make sure she has enough of everything she needs for sugar control. Remind pt of importance of remembering meal time insulin.

## 2013-08-14 NOTE — Telephone Encounter (Signed)
Pt states that she would rather go back to using the injections. She said the last time she took the patch off it left open sores & that her skin is just so sensitive.

## 2013-08-14 NOTE — Telephone Encounter (Signed)
Pt left message stating that the adhesive from the V-GO is giving her a bad itchy rash.  She states that it "feels itchy like chicken pox".

## 2013-08-14 NOTE — Telephone Encounter (Signed)
Pt states she has a novolog pen but is out of the lantus pen.  She said it's 42 units but last OV note before the V-GO says 47 units.

## 2013-08-14 NOTE — Telephone Encounter (Signed)
Did she ever try the wipes to help with reaction of rash?  I will send some cream to put on rash that is itchy. We may need to stop using if not able to tolerate.

## 2013-08-15 ENCOUNTER — Other Ambulatory Visit: Payer: Self-pay | Admitting: Family Medicine

## 2013-08-15 ENCOUNTER — Other Ambulatory Visit (HOSPITAL_COMMUNITY): Payer: Self-pay | Admitting: Family Medicine

## 2013-08-15 NOTE — Telephone Encounter (Signed)
Ok to refill lantus at 42units at bedtime. Refill 6.

## 2013-08-16 ENCOUNTER — Other Ambulatory Visit: Payer: Self-pay | Admitting: *Deleted

## 2013-08-16 MED ORDER — INSULIN GLARGINE 100 UNIT/ML ~~LOC~~ SOLN: 42.0000 [IU] | Freq: Every day | SUBCUTANEOUS | Status: DC

## 2013-08-16 NOTE — Telephone Encounter (Signed)
rx sent

## 2013-08-30 ENCOUNTER — Other Ambulatory Visit: Payer: Self-pay | Admitting: Family Medicine

## 2013-09-03 ENCOUNTER — Ambulatory Visit: Payer: Self-pay | Admitting: Physician Assistant

## 2013-09-03 DIAGNOSIS — Z0289 Encounter for other administrative examinations: Secondary | ICD-10-CM

## 2013-09-14 ENCOUNTER — Other Ambulatory Visit: Payer: Self-pay | Admitting: Physician Assistant

## 2013-09-21 ENCOUNTER — Other Ambulatory Visit: Payer: Self-pay | Admitting: Physician Assistant

## 2013-10-16 ENCOUNTER — Other Ambulatory Visit: Payer: Self-pay | Admitting: Physician Assistant

## 2013-10-21 ENCOUNTER — Other Ambulatory Visit: Payer: Self-pay | Admitting: Family Medicine

## 2013-10-25 ENCOUNTER — Other Ambulatory Visit: Payer: Self-pay | Admitting: Physician Assistant

## 2013-11-01 ENCOUNTER — Other Ambulatory Visit: Payer: Self-pay | Admitting: Family Medicine

## 2013-11-14 ENCOUNTER — Other Ambulatory Visit: Payer: Self-pay | Admitting: Physician Assistant

## 2013-11-20 ENCOUNTER — Other Ambulatory Visit (HOSPITAL_COMMUNITY): Payer: Self-pay | Admitting: Physician Assistant

## 2013-11-21 NOTE — Telephone Encounter (Signed)
Is this ok to fill?  I did not know if you are filling this or behavioral health

## 2013-11-23 ENCOUNTER — Ambulatory Visit (INDEPENDENT_AMBULATORY_CARE_PROVIDER_SITE_OTHER): Payer: PRIVATE HEALTH INSURANCE | Admitting: Physician Assistant

## 2013-11-23 ENCOUNTER — Other Ambulatory Visit: Payer: Self-pay | Admitting: *Deleted

## 2013-11-23 ENCOUNTER — Encounter: Payer: Self-pay | Admitting: Physician Assistant

## 2013-11-23 VITALS — BP 166/85 | HR 103 | Wt 211.0 lb

## 2013-11-23 DIAGNOSIS — K219 Gastro-esophageal reflux disease without esophagitis: Secondary | ICD-10-CM

## 2013-11-23 DIAGNOSIS — N3281 Overactive bladder: Secondary | ICD-10-CM

## 2013-11-23 DIAGNOSIS — IMO0001 Reserved for inherently not codable concepts without codable children: Secondary | ICD-10-CM

## 2013-11-23 DIAGNOSIS — I10 Essential (primary) hypertension: Secondary | ICD-10-CM

## 2013-11-23 DIAGNOSIS — E1165 Type 2 diabetes mellitus with hyperglycemia: Secondary | ICD-10-CM

## 2013-11-23 DIAGNOSIS — N318 Other neuromuscular dysfunction of bladder: Secondary | ICD-10-CM

## 2013-11-23 DIAGNOSIS — IMO0002 Reserved for concepts with insufficient information to code with codable children: Secondary | ICD-10-CM

## 2013-11-23 DIAGNOSIS — Z23 Encounter for immunization: Secondary | ICD-10-CM

## 2013-11-23 LAB — POCT GLYCOSYLATED HEMOGLOBIN (HGB A1C): HEMOGLOBIN A1C: 13

## 2013-11-23 MED ORDER — ESCITALOPRAM OXALATE 20 MG PO TABS: ORAL_TABLET | ORAL | Status: DC

## 2013-11-23 MED ORDER — PANTOPRAZOLE SODIUM 40 MG PO TBEC: 40.0000 mg | DELAYED_RELEASE_TABLET | Freq: Every day | ORAL | Status: DC

## 2013-11-23 MED ORDER — CANAGLIFLOZIN 100 MG PO TABS: ORAL_TABLET | ORAL | Status: DC

## 2013-11-23 MED ORDER — OLMESARTAN-AMLODIPINE-HCTZ 40-10-25 MG PO TABS: ORAL_TABLET | ORAL | Status: DC

## 2013-11-23 MED ORDER — METFORMIN HCL 1000 MG PO TABS
ORAL_TABLET | ORAL | Status: DC
Start: 2013-11-23 — End: 2014-05-11

## 2013-11-23 MED ORDER — OXYBUTYNIN CHLORIDE ER 10 MG PO TB24
ORAL_TABLET | ORAL | Status: DC
Start: 2013-11-23 — End: 2014-09-17

## 2013-11-23 MED ORDER — ATENOLOL 25 MG PO TABS
ORAL_TABLET | ORAL | Status: DC
Start: 2013-11-23 — End: 2014-05-11

## 2013-11-23 MED ORDER — BUPROPION HCL ER (XL) 150 MG PO TB24: ORAL_TABLET | ORAL | Status: DC

## 2013-11-23 NOTE — Patient Instructions (Signed)
Will refer to endocrinology

## 2013-11-25 DIAGNOSIS — N3281 Overactive bladder: Secondary | ICD-10-CM | POA: Insufficient documentation

## 2013-11-25 DIAGNOSIS — K219 Gastro-esophageal reflux disease without esophagitis: Secondary | ICD-10-CM | POA: Insufficient documentation

## 2013-11-25 NOTE — Progress Notes (Signed)
   Subjective:    Patient ID: Jennifer Conrad, female    DOB: 01/10/1961, 53 y.o.   MRN: [id number]  HPI Pt presents to the clinic to get medication refill.   DM- we were improving but since last visit pt has stopped vgo because of skin irritation. pharmacist getting wipes to help with irritation. Went back on lantus 52 units at bedtime and novolog 10 units at breakfast and dinner when she thought about it. Was not compliant with insulin. That is why we tried vgo does not remember to take.  Admits to not keeping a good diet. Eating lots of sugar and carbs. Denies any neuropathy. No hypoglycemic events. Always feels depressed, fatigue, tired. Not checking glucose. Continue on metformin.   GERD-controlled on protonix and carafate.   OAB-controlled with ditropan.   HTN- denies any CP, palptiatons, headaches, or vision changes. Did not take medication this am. Checked at pharmacy recently and BP was 135/79.  Depression- fairly controlled on lexapro. Always in and out of depression. No exercise.    Review of Systems     Objective:   Physical Exam  Constitutional: She is oriented to person, place, and time. She appears well-developed and well-nourished.  HENT:  Head: Normocephalic and atraumatic.  Cardiovascular: Regular rhythm and normal heart sounds.   Tachycardia at 103.   Pulmonary/Chest: Effort normal and breath sounds normal. She has no wheezes.  Neurological: She is alert and oriented to person, place, and time.  Skin: Skin is dry.  Psychiatric: She has a normal mood and affect. Her behavior is normal.          Assessment & Plan:  Diabetes type II uncontrolled- A1C 13.0. We were making headway and getting a1c down with VGO but she stopped using it until could get wipes to help with skin irritation. Pt has not been historically compliant with medication. I feel like endocrinology and more diabetic education would be best course for patient. Pt agrees to start back on VGO. She  was on 8clicks at every meal. If she starts she is to stop basal and bolus insulin. Continue on metformin. I did give samples of invokana. Discussed side effects. Follow up in 1 month if not able to get in with endocrinologist. Pt encouraged to start checking glucose every morning and keeping log.   OAB- refilled ditropan.   GERD-refilled protonix.   HTN-BP not controlled but did not take medication this am. Pt was started on tribenzor recently and continue on atenolol. Follow up in one month to make sure staying controlled. BP per pt controlled at home.   Depression/anxiety- refilled lexapro.   Tdap vaccine given today.

## 2013-11-29 ENCOUNTER — Encounter: Payer: Self-pay | Admitting: Physician Assistant

## 2013-12-10 LAB — COMPLETE METABOLIC PANEL WITH GFR
ALBUMIN: 4 g/dL (ref 3.5–5.2)
ALT: 31 U/L (ref 0–35)
AST: 24 U/L (ref 0–37)
Alkaline Phosphatase: 79 U/L (ref 39–117)
BUN: 13 mg/dL (ref 6–23)
CO2: 31 meq/L (ref 19–32)
CREATININE: 0.67 mg/dL (ref 0.50–1.10)
Calcium: 9.1 mg/dL (ref 8.4–10.5)
Chloride: 98 mEq/L (ref 96–112)
GFR, Est African American: >89 mL/min
GFR, Est Non African American: >89 mL/min
Glucose, Bld: 158 mg/dL — ABNORMAL HIGH (ref 70–99)
POTASSIUM: 4.1 meq/L (ref 3.5–5.3)
Sodium: 140 mEq/L (ref 135–145)
Total Bilirubin: 0.4 mg/dL (ref 0.2–1.2)
Total Protein: 7.3 g/dL (ref 6.0–8.3)

## 2014-01-02 ENCOUNTER — Ambulatory Visit (INDEPENDENT_AMBULATORY_CARE_PROVIDER_SITE_OTHER): Payer: PRIVATE HEALTH INSURANCE | Admitting: Physician Assistant

## 2014-01-02 ENCOUNTER — Encounter: Payer: Self-pay | Admitting: Physician Assistant

## 2014-01-02 VITALS — BP 131/74 | HR 62 | Wt 208.0 lb

## 2014-01-02 DIAGNOSIS — IMO0001 Reserved for inherently not codable concepts without codable children: Secondary | ICD-10-CM

## 2014-01-02 DIAGNOSIS — Z9889 Other specified postprocedural states: Secondary | ICD-10-CM

## 2014-01-02 DIAGNOSIS — Z23 Encounter for immunization: Secondary | ICD-10-CM

## 2014-01-02 DIAGNOSIS — Z9049 Acquired absence of other specified parts of digestive tract: Secondary | ICD-10-CM

## 2014-01-02 DIAGNOSIS — E1165 Type 2 diabetes mellitus with hyperglycemia: Secondary | ICD-10-CM

## 2014-01-02 DIAGNOSIS — I10 Essential (primary) hypertension: Secondary | ICD-10-CM

## 2014-01-02 NOTE — Patient Instructions (Signed)
Need eye exam

## 2014-01-02 NOTE — Progress Notes (Signed)
   Subjective:    Patient ID: Jennifer Conrad, female    DOB: February 22, 1961, 53 y.o.   MRN: [id number]  HPI Pt presents to the clinic to follow up on HTN.  HTN- pt has started atenolol and tolerating well. No SE. Denies any CP, dizziness, headaches or vision changes. Not checking BP.   DM- last a1c was 13.0. She has since started back on VGO 30 up to 8 clicks at each meal. Continues on metformin and invokana. No SE except occasional increased frequency in urination. Trying to watch carbs and sugars more. Denies any exercise. Has appt in June with endocrinologist. Not met with nutritionist. She does feel better and like her sugars have improved pre-prandial and sometimes has to use less clicks. No hypoglycemic events. She does have a surgery incision that she wants to make sure healing correctly.   12/22/13 emergency laparoscopic appendectomy was done. She would like for me to look at incision. She is feeling fine and denies any abdominal pain.     Review of Systems     Objective:   Physical Exam  Constitutional: She is oriented to person, place, and time. She appears well-developed and well-nourished.  HENT:  Head: Normocephalic and atraumatic.  Cardiovascular: Normal rate, regular rhythm and normal heart sounds.   Pulmonary/Chest: Effort normal and breath sounds normal.  Abdominal:    Neurological: She is alert and oriented to person, place, and time.  Psychiatric: She has a normal mood and affect. Her behavior is normal.          Assessment & Plan:  HTN- doing great today. Continue on tribenzor and atenolol bid. Follow up in 6 months.   DM- not due for a1c at this time. Needed prevnar today. Given without complications. Continue on VGO, invokana, metformin. Follow up a1c with be with endocrinology. Encouraged to get eye exam this year last eye exam was 3/14. Will get scheduled with nutritionist.   S/P appendectomy- well healing scar. Did bandage today due to scab formation and  potential grabbing on clothes. Discussed signs of infection. Will follow up as needed.

## 2014-04-02 ENCOUNTER — Other Ambulatory Visit: Payer: Self-pay | Admitting: Physician Assistant

## 2014-05-11 ENCOUNTER — Other Ambulatory Visit: Payer: Self-pay | Admitting: Physician Assistant

## 2014-06-11 ENCOUNTER — Other Ambulatory Visit: Payer: Self-pay | Admitting: Physician Assistant

## 2014-07-06 ENCOUNTER — Other Ambulatory Visit: Payer: Self-pay | Admitting: Family Medicine

## 2014-07-08 ENCOUNTER — Other Ambulatory Visit: Payer: Self-pay | Admitting: Family Medicine

## 2014-07-08 ENCOUNTER — Other Ambulatory Visit: Payer: Self-pay | Admitting: Physician Assistant

## 2014-08-17 ENCOUNTER — Other Ambulatory Visit: Payer: Self-pay | Admitting: Physician Assistant

## 2014-08-26 ENCOUNTER — Other Ambulatory Visit: Payer: Self-pay | Admitting: Physician Assistant

## 2014-09-04 ENCOUNTER — Ambulatory Visit (INDEPENDENT_AMBULATORY_CARE_PROVIDER_SITE_OTHER): Payer: PRIVATE HEALTH INSURANCE | Admitting: Physician Assistant

## 2014-09-04 ENCOUNTER — Encounter: Payer: Self-pay | Admitting: Physician Assistant

## 2014-09-04 VITALS — BP 174/76 | HR 73 | Ht 62.0 in | Wt 215.0 lb

## 2014-09-04 DIAGNOSIS — F329 Major depressive disorder, single episode, unspecified: Secondary | ICD-10-CM

## 2014-09-04 DIAGNOSIS — M7521 Bicipital tendinitis, right shoulder: Secondary | ICD-10-CM

## 2014-09-04 DIAGNOSIS — F32A Depression, unspecified: Secondary | ICD-10-CM

## 2014-09-04 DIAGNOSIS — E1165 Type 2 diabetes mellitus with hyperglycemia: Secondary | ICD-10-CM

## 2014-09-04 DIAGNOSIS — IMO0002 Reserved for concepts with insufficient information to code with codable children: Secondary | ICD-10-CM

## 2014-09-04 DIAGNOSIS — I10 Essential (primary) hypertension: Secondary | ICD-10-CM

## 2014-09-04 MED ORDER — ESCITALOPRAM OXALATE 20 MG PO TABS: ORAL_TABLET | ORAL | Status: DC

## 2014-09-04 MED ORDER — ATENOLOL 25 MG PO TABS: ORAL_TABLET | ORAL | Status: DC

## 2014-09-04 MED ORDER — OLMESARTAN-AMLODIPINE-HCTZ 40-10-25 MG PO TABS: ORAL_TABLET | ORAL | Status: DC

## 2014-09-04 MED ORDER — MELOXICAM 15 MG PO TABS: 15.0000 mg | ORAL_TABLET | Freq: Every day | ORAL | Status: DC

## 2014-09-04 NOTE — Patient Instructions (Signed)
Biceps Tendon Disruption (Proximal) with Rehab The biceps tendon attaches the biceps muscle to the bones of the elbow and the shoulder. A proximal biceps tendon disruption is a tear of the long head of the tendon that attaches near the shoulder (more common) or a tear in the muscle near the muscle tendon junction (less common). These injuries usually involve a complete tear of the tendon from the bone. However, partial tears are also possible. The biceps muscle works with other muscles to bend the elbow and rotate the palm upward (supinate). A complete biceps rupture will result in about a 10% decrease in elbow bending strength and a 20% decrease in your ability to turn the palm upward, using the wrist. SYMPTOMS   Pain, tenderness, swelling, warmth, or redness over the front of the shoulder.  Pain that gets worse with shoulder and elbow use, especially against resistance (lifting or carrying).  Bruising (contusion) in the arm or elbow after 24 to 48 hours.  Bulge can be seen and felt in the arm.  Limited motion of the shoulder or elbow.  Weakness with attempted elbow bending or rotation of the wrist, such as when using a screwdriver.  Crackling sound (crepitation) when the tendon or shoulder is moved or touched. CAUSES  A biceps tendon rupture occurs when the tendon is subjected to a force that is greater than it can withstand. For example, a sudden force straightening the elbow while the biceps is flexed, or a direct hit (trauma) (rare). RISK INCREASES WITH:   Sports that involve contact, or throwing and overhead activities (racquet sports, gymnastics, weightlifting, bodybuilding).  Heavy labor.  Poor strength and flexibility.  Failure to warm up properly before activity. PREVENTION  Warm up and stretch properly before activity.  Maintain physical fitness:  Strength, flexibility, and endurance.  Cardiovascular fitness.  Allow your body to recover between practices and  competition.  Learn and use proper exercise technique. PROGNOSIS  If treated properly, the symptoms of a proximal biceps tendon disruption usually go away within 12 weeks of injury.  RELATED COMPLICATIONS  Longer healing time if not properly treated, or if not given enough time to heal.  Recurring symptoms, especially if activity is resumed too soon.  Weakness of elbow bending and forearm rotation.  Prolonged disability (uncommon). TREATMENT Treatment first involves the use of ice and medicine, to reduce pain and inflammation. It is also important to perform strengthening and stretching exercises and to modify activities that cause symptoms to get worse. These exercises may be performed at home or with a therapist. It is not possible to surgically fix the tendon (sew it together). However, surgery may be performed to reinsert the tendon into the arm bone. This will make the arm look normal again. Surgery is most often advised for younger, active individuals, especially those who require strength of wrist rotation.  MEDICATION  If pain medicine is needed, nonsteroidal anti-inflammatory medicines (aspirin and ibuprofen), or other minor pain relievers (acetaminophen), are often advised.  Do not take pain medicine for 7 days before surgery.  Prescription pain relievers may be given if your caregiver thinks they are needed. Use only as directed and only as much as you need.  Corticosteroid injections may be given to help reduce inflammation, but are not usually recommended for this injury. HEAT AND COLD  Cold treatment (icing) should be applied for 10 to 15 minutes every 2 to 3 hours for inflammation and pain, and immediately after activity that aggravates your symptoms. Use ice packs   or an ice massage.  Heat treatment may be used before performing stretching and strengthening activities prescribed by your caregiver, physical therapist, or athletic trainer. Use a heat pack or a warm water  soak. SEEK MEDICAL CARE IF:   Symptoms get worse or do not improve in 2 weeks, despite treatment.  New, unexplained symptoms develop. (Drugs used in treatment may produce side effects.) EXERCISES RANGE OF MOTION (ROM) AND STRETCHING EXERCISES - Biceps Tendon Disruption (Proximal) These exercises may help you when beginning to rehabilitate your injury. Your symptoms may go away with or without further involvement from your physician, physical therapist or athletic trainer. While completing these exercises, remember:   Restoring tissue flexibility helps normal motion to return to the joints. This allows healthier, less painful movement and activity.  An effective stretch should be held for at least 30 seconds.  A stretch should never be painful. You should only feel a gentle lengthening or release in the stretched tissue. STRETCH - Flexion, Standing  Stand with good posture. With an underhand grip on your right / left hand and an overhand grip on the opposite hand, grasp a broomstick or cane so that your hands are a little more than shoulder width apart.  Keeping your right / left elbow straight and shoulder muscles relaxed, push the stick with your opposite hand to raise your right / left arm in front of your body and then overhead. Raise your arm until you feel a stretch in your right / left shoulder, but before you have increased shoulder pain.  Try to avoid shrugging your right / left shoulder as your arm rises by keeping your shoulder blade tucked down and toward your mid-back spine. Hold __________ seconds.  Slowly return to the starting position. Repeat __________ times. Complete this exercise __________ times per day. STRETCH - Abduction, Supine  Stand with good posture. With an underhand grip on your right / left hand and an overhand grip on the opposite hand, grasp a broomstick or cane so that your hands are a little more than shoulder-width apart.  Keeping your right / left  elbow straight and shoulder muscles relaxed, push the stick with your opposite hand to raise your right / left arm out to the side of your body and then overhead. Raise your arm until you feel a stretch in your right / left shoulder, but before you have increased shoulder pain.  Try to avoid shrugging your right / left shoulder as your arm rises, by keeping your shoulder blade tucked down and toward your mid-back spine. Hold for __________ seconds.  Slowly return to the starting position. Repeat __________ times. Complete this exercise __________ times per day. ROM - Flexion, Active-Assisted  Lie on your back. You may bend your knees for comfort.  Grasp a broomstick or cane so your hands are about shoulder width apart. Your right / left hand should grip the end of the stick so that your hand is positioned "thumbs-up," as if you were about to shake hands.  Using your healthy arm to lead, raise your right / left arm overhead until you feel a gentle stretch in your shoulder. Hold for right / left seconds.  Use the stick to assist in returning your right / left arm to its starting position. Repeat __________ times. Complete this exercise __________ times per day.  STRETCH - Flexion, Standing   Stand facing a wall. Walk your right / left fingers up the wall until you feel a moderate stretch in your   shoulder. As your hand gets higher, you may need to step closer to the wall or use a door frame to walk through.  Try to avoid shrugging your right / left shoulder as your arm rises, by keeping your shoulder blade tucked down and toward your mid-back spine.  Hold for __________ seconds. Use your other hand, if needed, to ease out of the stretch and return to the starting position. Repeat __________ times. Complete this exercise __________ times per day.  ROM - Internal Rotation   Using underhand grips, grasp a stick behind your back with both hands.  While standing upright with good posture, slide  the stick up your back until you feel a mild stretch in the front of your shoulder.  Hold for __________ seconds. Slowly return to your starting position. Repeat __________ times. Complete this exercise __________ times per day.  STRETCH - Internal Rotation  Place your right / left hand behind your back, palm-up.  Throw a towel or belt over your opposite shoulder. Grasp the towel with your right / left hand.  While keeping an upright posture, gently pull up on the towel until you feel a stretch in the front of your right / left shoulder.  Avoid shrugging your right / left shoulder as your arm rises, by keeping your shoulder blade tucked down and toward your mid-back spine.  Hold for __________ seconds. Release the stretch by lowering your opposite hand. Repeat __________ times. Complete this exercise __________ times per day. STRETCH - Elbow Flexors   Lie on a firm bed or countertop on your back. Be sure that you are in a comfortable position which will allow you to relax your arm muscles.  Place a folded towel under your right / left upper arm, so that your elbow and shoulder are at the same height. Extend your arm; your elbow should not rest on the bed or towel.  Allow the weight of your hand to straighten your elbow. Keep your arm and chest muscles relaxed. Your caregiver may ask you to increase the intensity of your stretch by adding a small wrist or hand weight.  Hold for __________ seconds. You should feel a stretch on the inside of your elbow. Slowly return to the starting position. Repeat __________ times. Complete this exercise __________ times per day. STRENGTHENING EXERCISES - Biceps Tendon Disruption (Proximal) These exercises may help you regain your strength after your physician has discontinued your restraint in a cast or brace. They may resolve your symptoms with or without further involvement from your physician, physical therapist or athletic trainer. While completing  these exercises, remember:   Muscles can gain both the endurance and the strength needed for everyday activities through controlled exercises.  Complete these exercises as instructed by your physician, physical therapist or athletic trainer. Progress the resistance and repetitions only as guided.  You may experience muscle soreness or fatigue, but the pain or discomfort you are trying to eliminate should never worsen during these exercises. If this pain does get worse, stop and make sure you are following the directions exactly. If the pain is still present after adjustments, discontinue the exercise until you can discuss the trouble with your clinician. STRENGTH - Elbow Flexors, Isometric   Stand or sit upright on a firm surface. Place your right / left arm so that your hand is palm-up and at the height of your waist.  Place your opposite hand on top of your forearm. Gently push down as your right / left arm   resists. Push as hard as you can with both arms, without causing any pain or movement at your right / left elbow. Hold this stationary position for __________ seconds.  Gradually release the tension in both arms. Allow your muscles to relax completely before repeating. Repeat __________ times. Complete this exercise __________ times per day. STRENGTH - Shoulder Flexion, Isometric  With good posture and facing a wall, stand or sit about 4-6 inches away.  Keeping your right / left elbow straight, gently press the top of your fist into the wall. Increase the pressure gradually until you are pressing as hard as you can without shrugging your shoulder or increasing any shoulder discomfort.  Hold for __________ seconds.  Release the tension slowly. Relax your shoulder muscles completely before you the next repetition. Repeat __________ times. Complete this exercise __________ times per day.  STRENGTH - Elbow Flexors, Supinated  With good posture, stand or sit on a firm chair without  armrests. Allow your right / left arm to rest at your side with your palm facing forward.  Holding a __________weight or gripping a rubber exercise band or tubing, bring your hand toward your shoulder.  Allow your muscles to control the resistance as your hand returns to your side. Repeat __________ times. Complete this exercise __________ times per day.  STRENGTH - Elbow Flexors, Neutral  With good posture, stand or sit on a firm chair without armrests. Allow your right / left arm to rest at your side with your thumb facing forward.  Holding a __________ weight or gripping a rubber exercise band or tubing, bring your hand toward your shoulder.  Allow your muscles to control the resistance as your hand returns to your side. Repeat __________ times. Complete this exercise __________ times per day.  STRENGTH - Shoulder Flexion   Stand or sit with good posture. Grasp a __________ weight, or an exercise band or tubing, so that your hand is "thumbs-up," like when you shake hands.  Slowly lift your right / left arm as far as you can without increasing any shoulder pain. Initially, many people can only raise their hand to shoulder height.  Avoid shrugging your right / left shoulder as your arm rises, by keeping your shoulder blade tucked down and toward your mid-back spine.  Hold for __________ seconds. Control the descent of your hand as you slowly return to your starting position. Repeat __________ times. Complete this exercise __________ times per day.  STRENGTH - Forearm Supinators   Sit with your right / left forearm supported on a table, keeping your elbow below shoulder height. Rest your hand over the edge, palm down.  Gently grip a hammer or a soup ladle.  Without moving your elbow, slowly turn your palm and hand upward to a "thumbs-up" position.  Hold this position for __________ seconds. Slowly return to the starting position. Repeat __________ times. Complete this exercise  __________ times per day.  STRENGTH - Forearm Pronators   Sit with your right / left forearm supported on a table, keeping your elbow below shoulder height. Rest your hand over the edge, palm up.  Gently grip a hammer or a soup ladle.  Without moving your elbow, slowly turn your palm and hand upward to a "thumbs-up" position.  Hold this position for __________ seconds. Slowly return to the starting position. Repeat __________ times. Complete this exercise __________ times per day.  Document Released: 08/23/2005 Document Revised: 11/15/2011 Document Reviewed: 12/05/2008 [employer] Patient Information 2015 [employer]. This information is not   intended to replace advice given to you by your health care provider. Make sure you discuss any questions you have with your health care provider.  

## 2014-09-04 NOTE — Progress Notes (Signed)
   Subjective:    Patient ID: Jennifer Conrad, female    DOB: 04/01/1961, 53 y.o.   MRN: [id number]  HPI  Patient presents to the clinic for 6 month medication follow-up.  Her diabetes is continuing to be managed by endocrinology. Her last A1c was 8.7. Very happy with her improvement.   Hypertension-she has been out for the past 3 days. She's been checking her blood pressures at home and been running below 140/90. Most the time her blood pressures are in the 120s over 80s. She denies any chest pain, palpitations, shortness of breath, vision changes. She has no problems or concerns.  She has been having some anterior right shoulder pain. His been going on since September. 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 She feels like the pain is getting worse. It is definitely worse when she is lifting. She denies any other trauma or injury. She has not done anything to make better.    Review of Systems  All other systems reviewed and are negative.      Objective:   Physical Exam  Constitutional: She is oriented to person, place, and time. She appears well-developed and well-nourished.  HENT:  Head: Normocephalic and atraumatic.  Cardiovascular: Normal rate, regular rhythm and normal heart sounds.   Pulmonary/Chest: Effort normal and breath sounds normal. She has no wheezes.  Musculoskeletal:  ROM of right shoulder normal with some discomfort when movement above right shoulder.  Pinpoint tenderness to palpation at the proximal bicep tendon insertion at right coracoid process.  Strength 5/5 of right shoulder.  Negative drop sign.  Some mild pain with hawkins test.  Some strain with empty can.   Neurological: She is alert and oriented to person, place, and time.  Skin: Skin is dry.  Psychiatric: She has a normal mood and affect. Her behavior is normal.          Assessment & Plan:  Diabetes mellitus type II,  uncontrolled-this is currently being managed by endocrinology. I discussed with patient giving them are card so that we were getting notes on management. She has an appointment coming up and will do so.  Hypertension-discuss importance of staying on blood pressure medication. Medications were refilled today for 6 month. Continue to check blood pressure at home and if running above 140/90 please follow-up. Follow-up in 6 months.  Bicep tendinitis, right-symptoms sound consistent with biceps tendinitis. Patient has pinpoint pain over the right proximal bicep tendon. Discussed need for rest. I did write patient out of lifting anything over than 5 pounds for the next 2 weeks. Patient was given mobic to take daily for next 2 weeks. She was given exercises to start daily along with icing 20min three times a day. HO with exercises given. In 2 weeks if no improvement can consider visit with Dr. Benjamin Stainhekkekandam for further intervention.

## 2014-09-08 ENCOUNTER — Other Ambulatory Visit: Payer: Self-pay | Admitting: Physician Assistant

## 2014-09-17 ENCOUNTER — Other Ambulatory Visit: Payer: Self-pay | Admitting: Physician Assistant

## 2014-10-21 ENCOUNTER — Ambulatory Visit: Payer: Self-pay | Admitting: Physician Assistant

## 2014-10-23 ENCOUNTER — Ambulatory Visit (INDEPENDENT_AMBULATORY_CARE_PROVIDER_SITE_OTHER): Payer: Commercial Managed Care - PPO | Admitting: Physician Assistant

## 2014-10-23 ENCOUNTER — Encounter: Payer: Self-pay | Admitting: Physician Assistant

## 2014-10-23 VITALS — BP 143/88 | HR 63 | Ht 62.0 in | Wt 219.0 lb

## 2014-10-23 DIAGNOSIS — M25511 Pain in right shoulder: Secondary | ICD-10-CM | POA: Diagnosis not present

## 2014-10-23 DIAGNOSIS — I10 Essential (primary) hypertension: Secondary | ICD-10-CM | POA: Diagnosis not present

## 2014-10-23 NOTE — Patient Instructions (Signed)
Will get MRI and formal PT.

## 2014-10-26 ENCOUNTER — Other Ambulatory Visit: Payer: Self-pay | Admitting: Physician Assistant

## 2014-10-29 DIAGNOSIS — M25511 Pain in right shoulder: Secondary | ICD-10-CM | POA: Insufficient documentation

## 2014-10-29 NOTE — Progress Notes (Signed)
   Subjective:    Patient ID: Jennifer Conrad, female    DOB: 04/12/1961, 54 y.o.   MRN: [id number]  HPI Pt presents to the clinic with right shoulder pain that continues. mobic has not helped at all. 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 Pain is worse at night. It is also worse with overhead movement and external rotation. Nothing seems to be helping.     Review of Systems  All other systems reviewed and are negative.      Objective:   Physical Exam  Musculoskeletal:  Range of motion of shoulder limited due to pain. She can approximately abduct to 90. External rotation is very painful.  There is some tenderness along the anterior acromion. Strength is 5 out of 5 of right shoulder.  Empty can is negative. Hawkins sign did reproduce some pain.           Assessment & Plan:   Right shoulder pain-originally I had thought it might be some bicep tendinitis. Upon further inspection it does seem like could be a labrum tear versus some impingement syndrome. I did given subacromial injection today. Would like to start formal PT. Continue medical. We'll consider MRI if no improvement after physical therapy.  Subacromial Shoulder Injection Procedure Note  Pre-operative Diagnosis: right shoulder pain  Post-operative Diagnosis: same  Indications: pain and decreased ROM  Anesthesia: ethyl chloride without added sodium bicarbonate  Procedure Details   Verbal consent was obtained for the procedure. The shoulder was prepped with Betadine and the skin was anesthetized. A 27 gauge needle was advanced into the subacromial space through posterior approach without difficulty  The space was then injected with 9 ml 1% lidocaine and 1 ml of depo medrol 40mg . The injection site was cleansed with isopropyl alcohol and a dressing was applied.  Complications:  None; patient tolerated the procedure well.   Hypertension-blood pressure was initially very elevated today. Did come  down after some time in office. Still borderline. Patient is on try been sore and atenolol. Would like to recheck in 4-6 weeks may need to add another medication for blood pressure control. We do need to have her blood pressure more controlled since she is a diabetic. Discussed with patient. Diet.

## 2014-11-14 ENCOUNTER — Ambulatory Visit (INDEPENDENT_AMBULATORY_CARE_PROVIDER_SITE_OTHER): Payer: Commercial Managed Care - PPO | Admitting: Physical Therapy

## 2014-11-14 DIAGNOSIS — M25511 Pain in right shoulder: Secondary | ICD-10-CM

## 2014-11-14 DIAGNOSIS — R29898 Other symptoms and signs involving the musculoskeletal system: Secondary | ICD-10-CM

## 2014-11-14 DIAGNOSIS — M25611 Stiffness of right shoulder, not elsewhere classified: Secondary | ICD-10-CM

## 2014-11-14 NOTE — Therapy (Signed)
[facility] [place] [address] [place], [place], [address] Phone: [phone]   Fax:  [phone]  Physical Therapy Evaluation  Patient Details  Name: Jennifer Conrad MRN: [id number] Date of Birth: [birth year] Referring Provider:  Jomarie Longs, PA-C  Encounter Date: 11/14/2014      PT End of Session - 11/14/14 1443    Visit Number 1   Number of Visits 13   Date for PT Re-Evaluation 12/26/14   PT Start Time 1404   PT Stop Time 1456   PT Time Calculation (min) 52 min   Activity Tolerance Patient tolerated treatment well;Patient limited by pain   Behavior During Therapy [facility] for tasks assessed/performed      Past Medical History  Diagnosis Date  . Obesity (BMI 30-39.9)     alowly losing weight/smaller portions/exercise 3 X week  . Depression   . Diabetes mellitus   . Hypertension     Past Surgical History  Procedure Laterality Date  . Abdominal hysterectomy    . Cholecystectomy    . Carpal tunnel release  10/2011  . Appendectomy      There were no vitals filed for this visit.  Visit Diagnosis:  Right shoulder pain - Plan: PT plan of care cert/re-cert  Decreased right shoulder range of motion - Plan: PT plan of care cert/re-cert  Weakness of right upper extremity - Plan: PT plan of care cert/re-cert      Subjective Assessment - 11/14/14 1409    Symptoms Pt is a 54 y/o female who presents to OPPT for R shoulder pain and difficulty with UE use.  Pt reports fall in [place] in Nov 2015 onto R UE and reports constant pain since then.   Limitations Lifting;House hold activities   Patient Stated Goals sleep through night without pain or need for medication, improve pain and motion; perform household activities with decreased pain   Currently in Pain? Yes   Pain Score 7    Pain Location Shoulder   Pain Orientation Right   Pain Descriptors / Indicators Sore;Throbbing   Pain Type Chronic pain   Pain Onset More  than a month ago   Pain Frequency Intermittent   Aggravating Factors  lifting, RUE use including overhead reaching, lying on R side   Pain Relieving Factors medication, ice            [facility] PT Assessment - 11/14/14 1413    Assessment   Medical Diagnosis L shoulder pain   Onset Date 08/02/14  date of fall   Next MD Visit PRN   Prior Therapy none   Precautions   Precaution Comments no heavy lifting (preschool kids)   Restrictions   Weight Bearing Restrictions No   Balance Screen   Has the patient fallen in the past 6 months Yes   How many times? 4-5   Has the patient had a decrease in activity level because of a fear of falling?  Yes   Is the patient reluctant to leave their home because of a fear of falling?  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; typical day includes lifting and cleaning   Leisure painting   Cognition   Overall Cognitive Status Within Functional Limits for tasks assessed   Observation/Other Assessments   Focus on Therapeutic Outcomes (FOTO)  44 (56% limited; predicted 36% limited)   Posture/Postural Control   Posture/Postural Control Postural limitations   Postural Limitations Rounded Shoulders;Forward head   AROM   AROM Assessment Site Shoulder   Right/Left Shoulder Right;Left   Right Shoulder Extension 42 Degrees   Right Shoulder Flexion 102 Degrees   Right Shoulder ABduction 86 Degrees   Right Shoulder Internal Rotation 54 Degrees  FIR T11/12 with pain   Right Shoulder External Rotation 20 Degrees  FER WNL with pain   Left Shoulder Extension 62 Degrees   Left Shoulder Flexion 141 Degrees   Left Shoulder ABduction 141 Degrees   Left Shoulder Internal Rotation --  FIR WNL   Left Shoulder External Rotation --  FER WNL   PROM   PROM  Assessment Site Shoulder   Right/Left Shoulder Right   Right Shoulder Extension 44 Degrees   Right Shoulder Flexion 115 Degrees   Right Shoulder ABduction 96 Degrees   Right Shoulder Internal Rotation 56 Degrees   Right Shoulder External Rotation 23 Degrees   Strength   Strength Assessment Site Shoulder;Elbow   Right/Left Shoulder Right;Left   Right Shoulder Flexion 3-/5   Right Shoulder Extension 3-/5   Right Shoulder ABduction 3-/5   Right Shoulder Internal Rotation 3/5   Right Shoulder External Rotation 3/5   Left Shoulder Flexion 4/5   Left Shoulder ABduction 4/5   Left Shoulder Internal Rotation 4/5   Left Shoulder External Rotation 4/5   Right/Left Elbow Right;Left   Right Elbow Flexion 3/5   Right Elbow Extension 3/5   Left Elbow Flexion 4/5   Left Elbow Extension 4/5   Palpation   Palpation pt very tender to palpation along ant shoulder; proximal biceps tendon as well as biceps muscle; tenderness along clavicle                   [facility] Adult PT Treatment/Exercise - 11/14/14 1413    Modalities   Modalities Cryotherapy;Electrical Stimulation   Cryotherapy   Number Minutes Cryotherapy 15 Minutes   Cryotherapy Location Shoulder   Type of Cryotherapy Ice pack   Electrical Stimulation   Electrical Stimulation Location r shoulder   Electrical Stimulation Action IFC   Electrical Stimulation Parameters to tolerance   Electrical Stimulation Goals Pain                PT Education - 11/14/14 1443    Education provided Yes   Education Details Clinical findings and POC   Person(s) Educated Patient   Methods Explanation   Comprehension Verbalized understanding          PT Short Term Goals - 11/14/14 1457    PT SHORT TERM GOAL #1   Title independent with initial HEP (12/05/14)   Time 3   Period Weeks   Status New   PT SHORT TERM GOAL #2   Title report pain < 4/10 for improved function and activity (12/05/14)   Time 3   Period Weeks   Status New    PT SHORT TERM GOAL #3   Title improve AROM R shoulder by at least 5 degrees limited all motions for improved mobility (12/05/14)   Time 3   Period Weeks   Status New           PT Long Term Goals - 11/14/14 1509    PT LONG TERM GOAL #1   Title independent with advanced HEP (12/26/14)   Time 6   Period Weeks   Status New   PT  LONG TERM GOAL #2   Title verbalize understanding of posture/body mechanics/lifting to decrease risk of reinjury (12/26/14)   Time 6   Period Weeks   Status New   PT LONG TERM GOAL #3   Title improve RUE shoulder ROM by 15 degrees for improved function (12/26/14)   Time 6   Period Weeks   Status New   PT LONG TERM GOAL #4   Title report pain < 3/10 for improved function R UE (12/26/14)   Time 6   Period Weeks   Status New   PT LONG TERM GOAL #5   Title report ability to sleep through night without pain (12/25/13)   Time 6   Period Weeks   Status New               Plan - 11/14/14 1444    Clinical Impression Statement Pt presents to OPPT with significant RUE limitations due to decreased ROM, strength and pain.  Will benefit from PT to maximize function and decrease pain.   Pt will benefit from skilled therapeutic intervention in order to improve on the following deficits Impaired UE functional use;Pain;Increased muscle spasms;Decreased strength;Decreased range of motion;Impaired flexibility;Improper body mechanics;Postural dysfunction;Decreased activity tolerance   Rehab Potential Good   PT Frequency 2x / week   PT Duration 6 weeks   PT Treatment/Interventions ADLs/Self Care Home Management;Electrical Stimulation;Cryotherapy;Functional mobility training;Neuromuscular re-education;Manual techniques;Ultrasound;Therapeutic exercise;Passive range of motion;Moist Heat;Therapeutic activities;Patient/family education   PT Next Visit Plan assess response to estim/ice; ?ionto (approved on initial order); AAROM exercises   Consulted and Agree with Plan of  Care Patient         Problem List Patient Active Problem List   Diagnosis Date Noted  . Right shoulder pain 10/29/2014  . S/P appendectomy 01/02/2014  . GERD (gastroesophageal reflux disease) 11/25/2013  . OAB (overactive bladder) 11/25/2013  . Depression 02/04/2012  . Diabetes mellitus type II 02/04/2012  . Major depressive disorder, recurrent episode, moderate 09/15/2011  . Generalized anxiety disorder 09/15/2011  . BINGE EATING DISORDER 06/10/2008  . OBSTRUCTIVE SLEEP APNEA 06/10/2008  . MIGRAINE UNSP W/O INTRACT W/O STATUS MIGRAINOSUS 06/10/2008  . BENIGN POSITIONAL VERTIGO 06/10/2008  . ESSENTIAL HYPERTENSION, BENIGN 06/10/2008  . ALLERGIC RHINITIS 06/10/2008  . Diabetes type 2, uncontrolled 05/22/2008  . DEPRESSION/ANXIETY 05/22/2008  . TRANSAMINASES, SERUM, ELEVATED 05/22/2008   Clarita CraneStephanie F Tison Leibold, PT, DPT 11/14/2014 3:18 PM  [facility] [address] [place], [place], [address] Phone: [phone]   Fax:  [phone]

## 2014-11-18 ENCOUNTER — Encounter (INDEPENDENT_AMBULATORY_CARE_PROVIDER_SITE_OTHER): Payer: Self-pay

## 2014-11-18 ENCOUNTER — Ambulatory Visit (INDEPENDENT_AMBULATORY_CARE_PROVIDER_SITE_OTHER): Payer: Commercial Managed Care - PPO | Admitting: Physical Therapy

## 2014-11-18 ENCOUNTER — Encounter: Payer: Self-pay | Admitting: *Deleted

## 2014-11-18 ENCOUNTER — Emergency Department (INDEPENDENT_AMBULATORY_CARE_PROVIDER_SITE_OTHER): Payer: Commercial Managed Care - PPO

## 2014-11-18 ENCOUNTER — Emergency Department
Admission: EM | Admit: 2014-11-18 | Discharge: 2014-11-18 | Disposition: A | Discharge status: Home / Self Care | Payer: Commercial Managed Care - PPO | Source: Home / Self Care | Attending: Family Medicine | Admitting: Family Medicine

## 2014-11-18 DIAGNOSIS — S40011A Contusion of right shoulder, initial encounter: Secondary | ICD-10-CM

## 2014-11-18 DIAGNOSIS — M7551 Bursitis of right shoulder: Secondary | ICD-10-CM | POA: Diagnosis not present

## 2014-11-18 DIAGNOSIS — M25511 Pain in right shoulder: Secondary | ICD-10-CM

## 2014-11-18 DIAGNOSIS — T148XXA Other injury of unspecified body region, initial encounter: Secondary | ICD-10-CM

## 2014-11-18 DIAGNOSIS — M85811 Other specified disorders of bone density and structure, right shoulder: Secondary | ICD-10-CM

## 2014-11-18 MED ORDER — HYDROCODONE-ACETAMINOPHEN 5-325 MG PO TABS: ORAL_TABLET | ORAL | Status: DC

## 2014-11-18 NOTE — Therapy (Signed)
[facility] [place] [address] [place], [place], [address] Phone: [phone]   Fax:  [phone]  Physical Therapy Treatment  Patient Details  Name: Jennifer Conrad MRN: [id number] Date of Birth: [birth year] Referring Provider:  Jomarie Longs, PA-C  Encounter Date: 11/18/2014      PT End of Session - 11/18/14 1435    Number of Visits 13   Date for PT Re-Evaluation 12/26/14   PT Start Time 1425   PT Stop Time 1436   PT Time Calculation (min) 11 min      Past Medical History  Diagnosis Date  . Obesity (BMI 30-39.9)     alowly losing weight/smaller portions/exercise 3 X week  . Depression   . Diabetes mellitus   . Hypertension     Past Surgical History  Procedure Laterality Date  . Abdominal hysterectomy    . Cholecystectomy    . Carpal tunnel release  10/2011  . Appendectomy      There were no vitals filed for this visit.  Visit Diagnosis:  Right shoulder pain      Subjective Assessment - 11/18/14 1427    Symptoms not good. got up 2X during the night to take meds. today it is very sore. she says there is swelling and extremely sensitvie to touch clavicle. Fell at [place] on Sat. with son tripped on top of her. difficult to lift arm today. PT came out to speak with pt . Suggest MD visit to evaluate new pain sites from fall.                                 PT Short Term Goals - 11/14/14 1457    PT SHORT TERM GOAL #1   Title independent with initial HEP (12/05/14)   Time 3   Period Weeks   Status New   PT SHORT TERM GOAL #2   Title report pain < 4/10 for improved function and activity (12/05/14)   Time 3   Period Weeks   Status New   PT SHORT TERM GOAL #3   Title improve AROM R shoulder by at least 5 degrees limited all motions for improved mobility (12/05/14)   Time 3   Period Weeks   Status New           PT Long Term Goals - 11/14/14 1509    PT LONG TERM GOAL #1   Title  independent with advanced HEP (12/26/14)   Time 6   Period Weeks   Status New   PT LONG TERM GOAL #2   Title verbalize understanding of posture/body mechanics/lifting to decrease risk of reinjury (12/26/14)   Time 6   Period Weeks   Status New   PT LONG TERM GOAL #3   Title improve RUE shoulder ROM by 15 degrees for improved function (12/26/14)   Time 6   Period Weeks   Status New   PT LONG TERM GOAL #4   Title report pain < 3/10 for improved function R UE (12/26/14)   Time 6   Period Weeks   Status New   PT LONG TERM GOAL #5   Title report ability to sleep through night without pain (12/25/13)   Time 6   Period Weeks   Status New               Plan - 11/18/14 1436    Clinical Impression Statement PT  recommend follow up with MD on new symptoms and fall.    Rehab Potential Good   PT Frequency 2x / week   PT Duration 6 weeks        Problem List Patient Active Problem List   Diagnosis Date Noted  . Right shoulder pain 10/29/2014  . S/P appendectomy 01/02/2014  . GERD (gastroesophageal reflux disease) 11/25/2013  . OAB (overactive bladder) 11/25/2013  . Depression 02/04/2012  . Diabetes mellitus type II 02/04/2012  . Major depressive disorder, recurrent episode, moderate 09/15/2011  . Generalized anxiety disorder 09/15/2011  . BINGE EATING DISORDER 06/10/2008  . OBSTRUCTIVE SLEEP APNEA 06/10/2008  . MIGRAINE UNSP W/O INTRACT W/O STATUS MIGRAINOSUS 06/10/2008  . BENIGN POSITIONAL VERTIGO 06/10/2008  . ESSENTIAL HYPERTENSION, BENIGN 06/10/2008  . ALLERGIC RHINITIS 06/10/2008  . Diabetes type 2, uncontrolled 05/22/2008  . DEPRESSION/ANXIETY 05/22/2008  . TRANSAMINASES, SERUM, ELEVATED 05/22/2008     Lady DeutscherSheila Parnell Natally Ribera, PTA  11/18/2014, 2:39 PM  [facility] [address] [place], [place], [address] Phone: [phone]   Fax:  [phone]

## 2014-11-18 NOTE — ED Provider Notes (Signed)
CSN: [id number]     Arrival date & time 11/18/14  1437 History   First MD Initiated Contact with Patient 11/18/14 1502     Chief Complaint  Patient presents with  . Clavicle Injury      HPI Comments: Patient tripped and fell on pavement two days ago, landing on her right clavicle.  She has had persistent swelling/pain over her right clavicle.    She is also undergoing PT for pain in her right shoulder, and has not had much improvement.  Patient is a 54 y.o. female presenting with shoulder pain. The history is provided by the patient.  Shoulder Pain Location:  Clavicle and shoulder Time since incident:  2 days Injury: yes   Mechanism of injury: fall   Fall:    Fall occurred:  Walking   Impact surface:  Primary school teacherConcrete   Point of impact: Right clavicle. Clavicle location:  R clavicle Shoulder location:  R shoulder Pain details:    Quality:  Aching   Radiates to:  Does not radiate   Severity:  Mild   Onset quality:  Sudden   Progression:  Unchanged Chronicity:  New Prior injury to area:  No Relieved by:  NSAIDs Worsened by:  Movement Associated symptoms: decreased range of motion, stiffness and swelling   Associated symptoms: no back pain, no neck pain, no numbness and no tingling     Past Medical History  Diagnosis Date  . Obesity (BMI 30-39.9)     alowly losing weight/smaller portions/exercise 3 X week  . Depression   . Diabetes mellitus   . Hypertension    Past Surgical History  Procedure Laterality Date  . Abdominal hysterectomy    . Cholecystectomy    . Carpal tunnel release  10/2011  . Appendectomy     Family History  Problem Relation Age of Onset  . Depression Sister   . Drug abuse Cousin   . Depression Sister   . Diabetes Mother   . Cancer Mother   . Cancer Father    History  Substance Use Topics  . Smoking status: Never Smoker   . Smokeless tobacco: Never Used  . Alcohol Use: No   OB History    No data available     Review of Systems    Musculoskeletal: Positive for stiffness. Negative for back pain and neck pain.  All other systems reviewed and are negative.   Allergies  Ace inhibitors  Home Medications   Prior to Admission medications   Medication Sig Start Date End Date Taking? Authorizing Provider  AMBULATORY NON FORMULARY MEDICATION Medication Name: One touch ultra mini strips  To test blood sugar three times a day. 06/30/12   Jade L Breeback, PA-C  atenolol (TENORMIN) 25 MG tablet Take 1 tablet by mouth twice a day 09/04/14   Jade L Breeback, PA-C  buPROPion (WELLBUTRIN XL) 150 MG 24 hr tablet TAKE 2 TABLETS (300 MG TOTAL) BY MOUTH EVERY MORNING. 09/17/14   Jade L Breeback, PA-C  canagliflozin (INVOKANA) 100 MG TABS tablet Take 1 tablet by mouth every day **OFFICE APPOINTMENT NEEDED FOR REFILLS** 08/27/14   Jade L Breeback, PA-C  escitalopram (LEXAPRO) 20 MG tablet Take 1 tablet by mouth daily 09/04/14   Jomarie LongsJade L Breeback, PA-C  HYDROcodone-acetaminophen (NORCO/VICODIN) 5-325 MG per tablet Take one by mouth at bedtime as needed for pain 11/18/14   Lattie HawStephen A Elden Brucato, MD  metFORMIN (GLUCOPHAGE) 1000 MG tablet TAKE 1 TABLET (1,000 MG TOTAL) BY MOUTH 3 (THREE) TIMES DAILY WITH  MEALS. 09/09/14   Jomarie Longs, PA-C  Olmesartan-Amlodipine-HCTZ [facility]) 40-10-25 MG TABS Take 1 tablet by mouth daily 09/04/14   Jade L Breeback, PA-C  oxybutynin (DITROPAN-XL) 10 MG 24 hr tablet TAKE 1 TABLET (10 MG TOTAL) BY MOUTH DAILY. 09/17/14   Jade L Breeback, PA-C  pantoprazole (PROTONIX) 40 MG tablet Take 1 tablet (40 mg total) by mouth daily. 10/26/14   Jade L Breeback, PA-C  traMADol (ULTRAM) 50 MG tablet Take 1 tablet (50 mg total) by mouth every 6 (six) hours as needed for pain. 02/19/13   Floydene Flock, MD  triamcinolone ointment (KENALOG) 0.5 % Apply 1 application topically 2 (two) times daily. 08/14/13   Jade L Breeback, PA-C   BP 158/79 mmHg  Pulse 82  Resp 16  Wt 214 lb (97.07 kg)  SpO2 95% Physical Exam  Constitutional: She  is oriented to person, place, and time. She appears well-developed and well-nourished. No distress.  Patient is obese   HENT:  Head: Atraumatic.  Eyes: Pupils are equal, round, and reactive to light.  Neck: Normal range of motion.  Cardiovascular: Normal heart sounds.   Pulmonary/Chest: Breath sounds normal.  Musculoskeletal:       Right shoulder: She exhibits decreased range of motion, tenderness, bony tenderness and swelling. She exhibits no deformity.       Arms: There is tenderness and swelling over the right mid-clavicle. Right shoulder reveals distinct tenderness over the sub-acromial bursa.  Neer's test positive  Neurological: She is alert and oriented to person, place, and time.  Skin: Skin is warm and dry. No rash noted.  Nursing note and vitals reviewed.   ED Course  Procedures  none  Imaging Review Dg Clavicle Right  11/18/2014   CLINICAL DATA:  Initial evaluation C/o RT clavicle pain after falling on to ground x 3 days ago.  EXAM: RIGHT CLAVICLE - 2+ VIEWS  COMPARISON:  None.  FINDINGS: No fracture or dislocation. 1.5 cm oval calcific density projects between the humeral head and the acromion process. There is also mild calcification over the peripheral fibers of the rotator cuff tendon.  IMPRESSION: No evidence of acute traumatic injury. Oval calcific density likely represents calcific tendinitis or possibly os acromiale anatomic variant. There is other evidence of calcific tendinitis as well.   Electronically Signed   By: Esperanza Heir M.D.   On: 11/18/2014 15:44     MDM   1. Contusion of right clavicle, initial encounter   2. Subacromial bursitis, right    Rx for Lortab at bedtime prn Apply ice pack for 20 to 30 minutes, 3 to 4 times daily  Continue until pain decreases.  Continue Ibuprofen , 4 tabs every 8 hours with food.  Continue shoulder exercises. Followup with Dr. Rodney Langton ([facility]) for evaluation and treatment of right shoulder  bursitis.    Lattie Haw, MD 11/19/14 1011

## 2014-11-18 NOTE — ED Notes (Signed)
Pt [place] tripping and falling [place] days ago falling onto right side. SHe now has pain and swelling to right clavicle

## 2014-11-18 NOTE — Discharge Instructions (Signed)
Apply ice pack for 20 to 30 minutes, 3 to 4 times daily  Continue until pain decreases.  Continue Ibuprofen 200mg , 4 tabs every 8 hours with food.  Continue shoulder exercises.   Bursitis Bursitis is inflammation of a bursa. A bursa is a soft, fluid-filled sac. It cushions the soft tissue around a bone. Bursitis often occurs in the bursas near the shoulders, elbows, knees, pelvis, hips, heel, and Achilles tendon.  SYMPTOMS   Pain and tenderness in the affected area. Sometimes, pain radiates into surrounding areas. Specifically, pain with movement.  Limited range of motion of the affected joint.  Sometimes, painless swelling of the bursa.  Fever (when infected). CAUSES   Injury to a joint or bursa.  Overuse or strenuous exercise of a joint.  Gout (disease with inflamed joints).  Prolonged pressure on a joint containing bursas (resting on an elbow or kneeling).  Arthritis.  Acute or chronic infection.  Calcium deposits in shoulder tendons, with degeneration of the tendon. RISK INCREASES WITH:  Vigorous, repeated, or sudden increase in athletic training or activity level.  Failure to warm up properly.  Overstretching.  Improper exercise technique.  Playing sports on [place]f. PREVENTION  Avoid injuries or overuse of muscles.  Warm up and cool down properly. Do this before and after physical activity.  Maintain proper conditioning:  Joint flexibility.  Muscle strength and endurance.  Cardiovascular fitness.  Learn and use proper technique.  Wear protective equipment. PROGNOSIS  With proper treatment, symptoms often go away within 7 to 14 days.  RELATED COMPLICATIONS   Frequent recurrence of symptoms. This can result in a chronic, repetitive problem.  Joint stiffness.  Limited joint movement.  Infection of bursa.  Chronic inflammation or scarring of bursa. TREATMENT Treatment first involves protecting and resting the bursa and its joint. You may use  ice or an elastic bandage to reduce inflammation. Anti-inflammatory medicines may help resolve the swelling. If symptoms persist despite treatment, a caregiver may withdraw fluid from the bursa. They might also consider a corticosteroid injection. Sometimes, bursitis will persist in spite of nonsurgical treatment or will become infected. These cases may require removal (surgical excision) of the bursa.  MEDICATION   If pain medicine is needed, nonsteroidal anti-inflammatory medicines, such as aspirin and ibuprofen, or other minor pain relievers, such as acetaminophen, are often recommended.  Do not take pain medicine for 7 days before surgery.  Prescription pain relievers are usually only prescribed after surgery. Use only as directed and only as much as you need.  Ointments applied to the skin may be helpful.  Corticosteroid injections may be given. This is done to reduce inflammation in the bursa. HEAT AND COLD:  Cold treatment (icing) relieves pain and reduces inflammation. Cold treatment should be applied for 10 to 15 minutes every 2 to 3 hours for inflammation and pain, and immediately after any activity that aggravates your symptoms. Use ice packs or an ice massage.  Heat treatment may be used prior to performing the stretching and strengthening activities prescribed by your caregiver, physical therapist, or athletic trainer. Use a heat pack or a warm soak. SEEK MEDICAL CARE IF:   Symptoms get worse or do not improve in 2 weeks, despite treatment.  New, unexplained symptoms develop. (Drugs used in treatment may produce side effects.) Document Released: 08/23/2005 Document Revised: 01/07/2014 Document Reviewed: 12/05/2008 [facility] Patient Information 2015 [employer]. This information is not intended to replace advice given to you by your health care provider. Make sure you discuss  any questions you have with your health care provider.

## 2014-11-20 ENCOUNTER — Encounter: Payer: Self-pay | Admitting: Physical Therapy

## 2014-11-22 ENCOUNTER — Encounter: Payer: Self-pay | Admitting: Physical Therapy

## 2014-11-22 ENCOUNTER — Institutional Professional Consult (permissible substitution): Payer: Self-pay | Admitting: Sports Medicine

## 2014-11-25 LAB — HM DIABETES EYE EXAM

## 2014-11-26 ENCOUNTER — Ambulatory Visit (INDEPENDENT_AMBULATORY_CARE_PROVIDER_SITE_OTHER): Payer: Commercial Managed Care - PPO | Admitting: Physical Therapy

## 2014-11-26 DIAGNOSIS — M25511 Pain in right shoulder: Secondary | ICD-10-CM | POA: Diagnosis not present

## 2014-11-26 DIAGNOSIS — M25611 Stiffness of right shoulder, not elsewhere classified: Secondary | ICD-10-CM

## 2014-11-26 DIAGNOSIS — R29898 Other symptoms and signs involving the musculoskeletal system: Secondary | ICD-10-CM

## 2014-11-26 NOTE — Patient Instructions (Signed)
External Rotator Cuff Stretch, Sitting (Passive)   Sit with elbow bent, forearm on table, palm down. Bend forward at waist until a stretch is felt in shoulder. Hold _10-30__ seconds.  Repeat _2__ times per session. Do __2-3 _ sessions per day.  Copyright  VHI. All rights reserved.   [facility] [address] [place], [place] [address]  [phone] (office) [phone] (fax)

## 2014-11-26 NOTE — Therapy (Signed)
[facility] [address] [place], [place], [address] Phone: [phone]   Fax:  [phone]  Physical Therapy Treatment  Patient Details  Name: Jennifer Conrad MRN: [id number] Date of Birth: 12/23/1960 Referring Provider:  Breeback, Jade L, PA-C  Encounter Date: 11/26/2014      PT End of Session - 11/26/14 1102    Visit Number 2   Number of Visits 13   Date for PT Re-Evaluation 12/26/14   PT Start Time 1101   PT Stop Time 1202   PT Time Calculation (min) 61 min   Activity Tolerance Patient tolerated treatment well      Past Medical History  Diagnosis Date  . Obesity (BMI 30-39.9)     alowly losing weight/smaller portions/exercise 3 X week  . Depression   . Diabetes mellitus   . Hypertension     Past Surgical History  Procedure Laterality Date  . Abdominal hysterectomy    . Cholecystectomy    . Carpal tunnel release  10/2011  . Appendectomy      There were no vitals filed for this visit.  Visit Diagnosis:  Right shoulder pain  Decreased right shoulder range of motion  Weakness of right upper extremity      Subjective Assessment - 11/26/14 1103    Symptoms Pt reports pain in Rt bicep muscle, throughout day and worsens at night. "It feels so tight" Pt reports feeling the same as initial visit.    Currently in Pain? Yes   Pain Score 7    Pain Location Shoulder   Pain Descriptors / Indicators Aching   Aggravating Factors  lifting, overuse, overhead reach    Pain Relieving Factors medication, ice and massage             [facility] PT Assessment - 11/26/14 0001    Assessment   Medical Diagnosis L shoulder pain   Onset Date 08/02/14   Next MD Visit PRN   AROM   AROM Assessment Site Shoulder   Right/Left Shoulder Right   Right Shoulder Flexion 90 Degrees  seated   Right Shoulder ABduction 90 Degrees  seated                   [facility] Adult PT Treatment/Exercise - 11/26/14 0001    Exercises   Exercises Shoulder   Shoulder Exercises: Supine   External Rotation AAROM;Right;10 reps   Flexion AAROM;Right  2 reps; stopped due to increased pain   Other Supine Exercises press up with cane x 10    Shoulder Exercises: Seated   Other Seated Exercises table stretch x 5 reps in flexion and abd of Rt shoulder    Shoulder Exercises: Pulleys   Flexion 2 minutes   ABduction 2 minutes   Shoulder Exercises: ROM/Strengthening   UBE (Upper Arm Bike) Level 1, 2.5 min forward (unable to tolerate backward due ot pain)    Shoulder Exercises: Isometric Strengthening   Flexion 5X10"   Extension 5X10"   External Rotation 5X10"   Internal Rotation 5X10"   Modalities   Modalities Cryotherapy   Cryotherapy   Number Minutes Cryotherapy 15 Minutes   Cryotherapy Location Shoulder   Type of Cryotherapy --  vaso, med pressure                PT Education - 11/26/14 1130    Education provided Yes   Education Details HEP.   Shoulder supine cane, shoulder isometrics, and seated flexion stretch.    Person(s)   Educated Patient   Methods Explanation;Demonstration;Handout   Comprehension Verbalized understanding;Returned demonstration          PT Short Term Goals - 11/14/14 1457    PT SHORT TERM GOAL #1   Title independent with initial HEP (12/05/14)   Time 3   Period Weeks   Status New   PT SHORT TERM GOAL #2   Title report pain < 4/10 for improved function and activity (12/05/14)   Time 3   Period Weeks   Status New   PT SHORT TERM GOAL #3   Title improve AROM R shoulder by at least 5 degrees limited all motions for improved mobility (12/05/14)   Time 3   Period Weeks   Status New           PT Long Term Goals - 11/14/14 1509    PT LONG TERM GOAL #1   Title independent with advanced HEP (12/26/14)   Time 6   Period Weeks   Status New   PT LONG TERM GOAL #2   Title verbalize understanding of posture/body mechanics/lifting to decrease risk of reinjury (12/26/14)    Time 6   Period Weeks   Status New   PT LONG TERM GOAL #3   Title improve RUE shoulder ROM by 15 degrees for improved function (12/26/14)   Time 6   Period Weeks   Status New   PT LONG TERM GOAL #4   Title report pain < 3/10 for improved function R UE (12/26/14)   Time 6   Period Weeks   Status New   PT LONG TERM GOAL #5   Title report ability to sleep through night without pain (12/25/13)   Time 6   Period Weeks   Status New               Plan - 11/26/14 1259    Clinical Impression Statement Pt tolerated all new exercises well for Rt shoulder. Pt reported decrease Rt shoulder pain/tightness with table stretches into ABD and Flex.  No goals met yet due to number of visits.    Pt will benefit from skilled therapeutic intervention in order to improve on the following deficits Impaired UE functional use;Pain;Increased muscle spasms;Decreased strength;Decreased range of motion;Impaired flexibility;Improper body mechanics;Postural dysfunction;Decreased activity tolerance   Rehab Potential Good   PT Frequency 2x / week   PT Duration 6 weeks   PT Treatment/Interventions ADLs/Self Care Home Management;Electrical Stimulation;Cryotherapy;Functional mobility training;Neuromuscular re-education;Manual techniques;Ultrasound;Therapeutic exercise;Passive range of motion;Moist Heat;Therapeutic activities;Patient/family education   PT Next Visit Plan Assess HEP exercises. Possible ionto to bicep tendon. Continue per plan of care.    Consulted and Agree with Plan of Care Patient        Problem List Patient Active Problem List   Diagnosis Date Noted  . Right shoulder pain 10/29/2014  . S/P appendectomy 01/02/2014  . GERD (gastroesophageal reflux disease) 11/25/2013  . OAB (overactive bladder) 11/25/2013  . Depression 02/04/2012  . Diabetes mellitus type II 02/04/2012  . Major depressive disorder, recurrent episode, moderate 09/15/2011  . Generalized anxiety disorder 09/15/2011  . BINGE  EATING DISORDER 06/10/2008  . OBSTRUCTIVE SLEEP APNEA 06/10/2008  . MIGRAINE UNSP W/O INTRACT W/O STATUS MIGRAINOSUS 06/10/2008  . BENIGN POSITIONAL VERTIGO 06/10/2008  . ESSENTIAL HYPERTENSION, BENIGN 06/10/2008  . ALLERGIC RHINITIS 06/10/2008  . Diabetes type 2, uncontrolled 05/22/2008  . DEPRESSION/ANXIETY 05/22/2008  . TRANSAMINASES, SERUM, ELEVATED 05/22/2008   Kerin Perna, PTA 11/26/2014 1:06 PM  [facility] [address] [place], [place], [address] Phone: [phone]   Fax:  [phone]      

## 2014-12-03 ENCOUNTER — Ambulatory Visit (INDEPENDENT_AMBULATORY_CARE_PROVIDER_SITE_OTHER): Payer: Commercial Managed Care - PPO | Admitting: Physical Therapy

## 2014-12-03 ENCOUNTER — Encounter: Payer: Self-pay | Admitting: Physical Therapy

## 2014-12-03 DIAGNOSIS — M25611 Stiffness of right shoulder, not elsewhere classified: Secondary | ICD-10-CM

## 2014-12-03 DIAGNOSIS — M25511 Pain in right shoulder: Secondary | ICD-10-CM | POA: Diagnosis not present

## 2014-12-03 DIAGNOSIS — R29898 Other symptoms and signs involving the musculoskeletal system: Secondary | ICD-10-CM

## 2014-12-03 NOTE — Therapy (Signed)
[facility] [address] [place], [place], [address] Phone: [phone]   Fax:  [phone]  Physical Therapy Treatment  Patient Details  Name: Jennifer Conrad MRN: [id number] Date of Birth: 02/24/1961 Referring Provider:  Jomarie Longs, PA-C  Encounter Date: 12/03/2014      PT End of Session - 12/03/14 1059    Visit Number 3   Number of Visits 13   Date for PT Re-Evaluation 12/26/14   PT Start Time 1021   PT Stop Time 1107   PT Time Calculation (min) 46 min   Activity Tolerance Patient tolerated treatment well      Past Medical History  Diagnosis Date  . Obesity (BMI 30-39.9)     alowly losing weight/smaller portions/exercise 3 X week  . Depression   . Diabetes mellitus   . Hypertension     Past Surgical History  Procedure Laterality Date  . Abdominal hysterectomy    . Cholecystectomy    . Carpal tunnel release  10/2011  . Appendectomy      There were no vitals filed for this visit.  Visit Diagnosis:  Right shoulder pain  Decreased right shoulder range of motion  Weakness of right upper extremity      Subjective Assessment - 12/03/14 1023    Symptoms pt with lots of pain last night, took a hydrocodone which helped, no pain right now   Currently in Pain? No/denies            [facility] PT Assessment - 12/03/14 0001    AROM   AROM Assessment Site Shoulder   Right/Left Shoulder Right   Right Shoulder Flexion 115 Degrees  seated   Right Shoulder ABduction 120 Degrees  seated                   [facility] Adult PT Treatment/Exercise - 12/03/14 0001    Shoulder Exercises: Seated   Other Seated Exercises table stretch x 5 flex and 5 abd   Shoulder Exercises: Standing   Retraction AROM;Both;10 reps  mirror for form, tactile cues   Shoulder Exercises: Pulleys   Flexion 2 minutes   ABduction 2 minutes   Shoulder Exercises: ROM/Strengthening   UBE (Upper Arm Bike) level 1, 2 min fwd,  2 min bkwd   Shoulder Exercises: Isometric Strengthening   External Rotation 5X10"   Internal Rotation 5X10"   Shoulder Exercises: Stretch   Corner Stretch 2 reps;30 seconds   Other Shoulder Stretches doorway stretch 2 x 30 seconds   Cryotherapy   Number Minutes Cryotherapy 15 Minutes   Cryotherapy Location Shoulder   Type of Cryotherapy --  vaso med pressure   Manual Therapy   Manual Therapy Joint mobilization   Joint Mobilization Rt scapular mobs to increase mobility                PT Education - 12/03/14 1057    Education provided Yes   Education Details addition to [employer]) Educated Patient   Methods Demonstration;Handout;Explanation   Comprehension Verbalized understanding;Returned demonstration          PT Short Term Goals - 12/03/14 1100    PT SHORT TERM GOAL #1   Title independent with initial HEP (12/05/14)   Status On-going   PT SHORT TERM GOAL #2   Title report pain < 4/10 for improved function and activity (12/05/14)   Status On-going   PT SHORT TERM GOAL #3   Title improve AROM R shoulder  by at least 5 degrees limited all motions for improved mobility (12/05/14)   Status Achieved           PT Long Term Goals - 12/03/14 1101    PT LONG TERM GOAL #1   Title independent with advanced HEP (12/26/14)   Status On-going   PT LONG TERM GOAL #2   Title verbalize understanding of posture/body mechanics/lifting to decrease risk of reinjury (12/26/14)   Status On-going   PT LONG TERM GOAL #3   Title improve RUE shoulder ROM by 15 degrees for improved function (12/26/14)   Status On-going   PT LONG TERM GOAL #4   Title report pain < 3/10 for improved function R UE (12/26/14)   Status On-going   PT LONG TERM GOAL #5   Title report ability to sleep through night without pain (12/25/13)   Status On-going               Plan - 12/03/14 1059    Clinical Impression Statement pt with noted decreased scapular mobility on Rt, ttp Rt middle and  anterior delt and pecs. pt improved with stretching   Pt will benefit from skilled therapeutic intervention in order to improve on the following deficits Impaired UE functional use;Pain;Increased muscle spasms;Decreased strength;Decreased range of motion;Impaired flexibility;Improper body mechanics;Postural dysfunction;Decreased activity tolerance   Rehab Potential Good   PT Frequency 2x / week   PT Duration 6 weeks   PT Treatment/Interventions ADLs/Self Care Home Management;Electrical Stimulation;Cryotherapy;Functional mobility training;Neuromuscular re-education;Manual techniques;Ultrasound;Therapeutic exercise;Passive range of motion;Moist Heat;Therapeutic activities;Patient/family education   PT Next Visit Plan assess new HEP, continue work on scapular mobility, ? need for thoracic mobs        Problem List Patient Active Problem List   Diagnosis Date Noted  . Right shoulder pain 10/29/2014  . S/P appendectomy 01/02/2014  . GERD (gastroesophageal reflux disease) 11/25/2013  . OAB (overactive bladder) 11/25/2013  . Depression 02/04/2012  . Diabetes mellitus type II 02/04/2012  . Major depressive disorder, recurrent episode, moderate 09/15/2011  . Generalized anxiety disorder 09/15/2011  . BINGE EATING DISORDER 06/10/2008  . OBSTRUCTIVE SLEEP APNEA 06/10/2008  . MIGRAINE UNSP W/O INTRACT W/O STATUS MIGRAINOSUS 06/10/2008  . BENIGN POSITIONAL VERTIGO 06/10/2008  . ESSENTIAL HYPERTENSION, BENIGN 06/10/2008  . ALLERGIC RHINITIS 06/10/2008  . Diabetes type 2, uncontrolled 05/22/2008  . DEPRESSION/ANXIETY 05/22/2008  . TRANSAMINASES, SERUM, ELEVATED 05/22/2008    Reggy EyeKaren Erline Siddoway, PT, DPT  12/03/2014, 11:03 AM  [facility] [address] [place], [place], [address] Phone: [phone]   Fax:  [phone]

## 2014-12-03 NOTE — Patient Instructions (Signed)
Handout with doorway stretch, IR stretch and scap retraction given

## 2014-12-06 ENCOUNTER — Encounter: Payer: Self-pay | Admitting: Physical Therapy

## 2014-12-09 ENCOUNTER — Encounter: Payer: Self-pay | Admitting: Physical Therapy

## 2014-12-09 ENCOUNTER — Ambulatory Visit (INDEPENDENT_AMBULATORY_CARE_PROVIDER_SITE_OTHER): Payer: Commercial Managed Care - PPO | Admitting: Physical Therapy

## 2014-12-09 DIAGNOSIS — R29898 Other symptoms and signs involving the musculoskeletal system: Secondary | ICD-10-CM | POA: Diagnosis not present

## 2014-12-09 DIAGNOSIS — M25611 Stiffness of right shoulder, not elsewhere classified: Secondary | ICD-10-CM

## 2014-12-09 DIAGNOSIS — M25511 Pain in right shoulder: Secondary | ICD-10-CM

## 2014-12-09 NOTE — Therapy (Signed)
[facility] [address] [place], [place], [address] Phone: [phone]   Fax:  [phone]  Physical Therapy Treatment  Patient Details  Name: Jennifer Conrad MRN: [id number] Date of Birth: 09/09/1960 Referring Provider:  Jomarie Longs, PA-C  Encounter Date: 12/09/2014      PT End of Session - 12/09/14 1405    Visit Number 3   Number of Visits 13   Date for PT Re-Evaluation 12/26/14   PT Start Time 1401   PT Stop Time 1446   PT Time Calculation (min) 45 min      Past Medical History  Diagnosis Date  . Obesity (BMI 30-39.9)     alowly losing weight/smaller portions/exercise 3 X week  . Depression   . Diabetes mellitus   . Hypertension     Past Surgical History  Procedure Laterality Date  . Abdominal hysterectomy    . Cholecystectomy    . Carpal tunnel release  10/2011  . Appendectomy      There were no vitals filed for this visit.  Visit Diagnosis:  Right shoulder pain  Decreased right shoulder range of motion  Weakness of right upper extremity      Subjective Assessment - 12/09/14 1402    Subjective Rt shoulder is really sore, feels like she lives on ibuprofen   Currently in Pain? Yes   Pain Score 5    Pain Location Shoulder   Pain Orientation Right;Anterior   Pain Descriptors / Indicators Aching;Pounding   Pain Type Chronic pain   Pain Onset More than a month ago   Pain Frequency Constant   Aggravating Factors  lifting   Pain Relieving Factors meds, ice, exercising            [facility] PT Assessment - 12/09/14 0001    Assessment   Medical Diagnosis L shoulder pain   Onset Date 08/02/14   AROM   Right Shoulder Flexion 145 Degrees   Right Shoulder ABduction 125 Degrees   Right Shoulder External Rotation 60 Degrees                   [facility] Adult PT Treatment/Exercise - 12/09/14 0001    Shoulder Exercises: Supine   Other Supine Exercises 2x10 red band ex, overhead,  horizontal abd and ER   Other Supine Exercises 2xfatigue shoulder flexion rhythmic stab   Shoulder Exercises: Seated   Other Seated Exercises 3x10 full can to 90 degrees   Shoulder Exercises: Prone   Horizontal ABduction 1 Right;20 reps  T's   Other Prone Exercises POE with wt shifting   Shoulder Exercises: Sidelying   External Rotation Strengthening;Right;Weights  3x10   External Rotation Weight (lbs) 1  with towel under elbow   Other Sidelying Exercises empty can 2x8   Shoulder Exercises: ROM/Strengthening   UBE (Upper Arm Bike) L2x2'/2'   Modalities   Modalities Cryotherapy   Cryotherapy   Number Minutes Cryotherapy 15 Minutes   Cryotherapy Location Shoulder  Rt   Type of Cryotherapy --  vaso, med pressure, 3*                  PT Short Term Goals - 12/09/14 1423    PT SHORT TERM GOAL #1   Title independent with initial HEP (12/05/14)   Status Achieved   PT SHORT TERM GOAL #2   Title report pain < 4/10 for improved function and activity (12/05/14)   Status On-going   PT SHORT TERM GOAL #3  Title improve AROM R shoulder by at least 5 degrees limited all motions for improved mobility (12/05/14)   Status Achieved           PT Long Term Goals - 12/03/14 1101    PT LONG TERM GOAL #1   Title independent with advanced HEP (12/26/14)   Status On-going   PT LONG TERM GOAL #2   Title verbalize understanding of posture/body mechanics/lifting to decrease risk of reinjury (12/26/14)   Status On-going   PT LONG TERM GOAL #3   Title improve RUE shoulder ROM by 15 degrees for improved function (12/26/14)   Status On-going   PT LONG TERM GOAL #4   Title report pain < 3/10 for improved function R UE (12/26/14)   Status On-going   PT LONG TERM GOAL #5   Title report ability to sleep through night without pain (12/25/13)   Status On-going               Plan - 12/09/14 1405    Clinical Impression Statement Pt reports increased pain when Rt RTC muscles are  isolated and used.     Pt will benefit from skilled therapeutic intervention in order to improve on the following deficits Impaired UE functional use;Pain;Increased muscle spasms;Decreased strength;Decreased range of motion;Impaired flexibility;Improper body mechanics;Postural dysfunction;Decreased activity tolerance   Rehab Potential Good   PT Frequency 2x / week   PT Duration 6 weeks   PT Treatment/Interventions ADLs/Self Care Home Management;Electrical Stimulation;Cryotherapy;Functional mobility training;Neuromuscular re-education;Manual techniques;Ultrasound;Therapeutic exercise;Passive range of motion;Moist Heat;Therapeutic activities;Patient/family education   PT Next Visit Plan progress HEP add in band exercise   Consulted and Agree with Plan of Care Patient        Problem List Patient Active Problem List   Diagnosis Date Noted  . Right shoulder pain 10/29/2014  . S/P appendectomy 01/02/2014  . GERD (gastroesophageal reflux disease) 11/25/2013  . OAB (overactive bladder) 11/25/2013  . Depression 02/04/2012  . Diabetes mellitus type II 02/04/2012  . Major depressive disorder, recurrent episode, moderate 09/15/2011  . Generalized anxiety disorder 09/15/2011  . BINGE EATING DISORDER 06/10/2008  . OBSTRUCTIVE SLEEP APNEA 06/10/2008  . MIGRAINE UNSP W/O INTRACT W/O STATUS MIGRAINOSUS 06/10/2008  . BENIGN POSITIONAL VERTIGO 06/10/2008  . ESSENTIAL HYPERTENSION, BENIGN 06/10/2008  . ALLERGIC RHINITIS 06/10/2008  . Diabetes type 2, uncontrolled 05/22/2008  . DEPRESSION/ANXIETY 05/22/2008  . TRANSAMINASES, SERUM, ELEVATED 05/22/2008    Roderic ScarceSusan Shaver, PT 12/09/2014, 2:33 PM  [facility] [address] [place], [place], [address] Phone: [phone]   Fax:  [phone]

## 2014-12-12 ENCOUNTER — Ambulatory Visit (INDEPENDENT_AMBULATORY_CARE_PROVIDER_SITE_OTHER): Payer: Commercial Managed Care - PPO | Admitting: Physical Therapy

## 2014-12-12 DIAGNOSIS — R29898 Other symptoms and signs involving the musculoskeletal system: Secondary | ICD-10-CM

## 2014-12-12 DIAGNOSIS — M25511 Pain in right shoulder: Secondary | ICD-10-CM

## 2014-12-12 DIAGNOSIS — M25611 Stiffness of right shoulder, not elsewhere classified: Secondary | ICD-10-CM

## 2014-12-12 NOTE — Therapy (Signed)
[facility] [address] [place], [place], [address] Phone: [phone]   Fax:  [phone]  Physical Therapy Treatment  Patient Details  Name: Jennifer Conrad MRN: [id number] Date of Birth: 12/27/1960 Referring Provider:  Jomarie LongsBreeback, Jade L, PA-C  Encounter Date: 12/12/2014      PT End of Session - 12/12/14 1511    Visit Number 4   Number of Visits 13   Date for PT Re-Evaluation 12/26/14   PT Start Time 1410   PT Stop Time 1519   PT Time Calculation (min) 69 min   Activity Tolerance Patient tolerated treatment well      Past Medical History  Diagnosis Date  . Obesity (BMI 30-39.9)     alowly losing weight/smaller portions/exercise 3 X week  . Depression   . Diabetes mellitus   . Hypertension     Past Surgical History  Procedure Laterality Date  . Abdominal hysterectomy    . Cholecystectomy    . Carpal tunnel release  10/2011  . Appendectomy      There were no vitals filed for this visit.  Visit Diagnosis:  Right shoulder pain  Decreased right shoulder range of motion  Weakness of right upper extremity      Subjective Assessment - 12/12/14 1407    Subjective Rt shoulder feels pretty good, but bicep is very bothersome. Woke pt up in night.  Rt shoulder feels "more mobile".    Patient Stated Goals sleep through night without pain or need for medication, improve pain and motion; perform household activities with decreased pain   Currently in Pain? Yes   Pain Score 4   on a daily basis 6/10   Pain Location Shoulder   Pain Orientation Right;Anterior   Pain Descriptors / Indicators Aching;Dull   Aggravating Factors  lifting, reaching back   Pain Relieving Factors meds, ice, exercising             [facility] Assessment - 12/12/14 0001    Strength   Right Shoulder Flexion 3+/5  pain    Right Shoulder Extension 4+/5   Right Shoulder ABduction 3+/5  with pain    Right Shoulder Internal Rotation  4+/5   Right Shoulder External Rotation 4+/5  with pain                    [facility] Adult PT Treatment/Exercise - 12/12/14 0001    Shoulder Exercises: Standing   External Rotation Strengthening;Right;10 reps;Theraband   Theraband Level (Shoulder External Rotation) Level 2 (Red)  2 sets    Flexion Strengthening;Right;10 reps;Theraband  [place] 4   Theraband Level (Shoulder Flexion) Level 3 (Green)  2 sets   Row Strengthening;Right;10 reps;Theraband  rockwood 4   Theraband Level (Shoulder Row) Level 3 (Green)  2 sets    Other Standing Exercises Wall ladder R shoulder flex and abd -each 5 reps  up to #24   Other Standing Exercises Elbow flexion with 4# x 10   Shoulder Exercises: ROM/Strengthening   UBE (Upper Arm Bike) L2x2'/2'   Modalities   Modalities Cryotherapy;Ultrasound   Cryotherapy   Number Minutes Cryotherapy 15 Minutes   Cryotherapy Location Shoulder  Rt   Type of Cryotherapy --  vaso, 3 snowflakes, med    Ultrasound   Ultrasound Location Rt pec minor/ Rt bicep belly    Ultrasound Parameters 4 min to each area; 3.3 mHz, 1.1 w/cm2, 50%    Ultrasound Goals Edema;Pain   Manual Therapy  Manual Therapy Other (comment)   Joint Mobilization Attempted GH mobs, unable to tolerate.  Pec release Rt and TPR to pec major                 PT Education - 12/12/14 1511    Education provided Yes   Education Details HEP    Person(s) Educated Patient   Methods Explanation;Demonstration;Handout   Comprehension Verbalized understanding;Returned demonstration          PT Short Term Goals - 12/09/14 1423    PT SHORT TERM GOAL #1   Title independent with initial HEP (12/05/14)   Status Achieved   PT SHORT TERM GOAL #2   Title report pain < 4/10 for improved function and activity (12/05/14)   Status On-going   PT SHORT TERM GOAL #3   Title improve AROM R shoulder by at least 5 degrees limited all motions for improved mobility (12/05/14)   Status Achieved            PT Long Term Goals - 12/03/14 1101    PT LONG TERM GOAL #1   Title independent with advanced HEP (12/26/14)   Status On-going   PT LONG TERM GOAL #2   Title verbalize understanding of posture/body mechanics/lifting to decrease risk of reinjury (12/26/14)   Status On-going   PT LONG TERM GOAL #3   Title improve RUE shoulder ROM by 15 degrees for improved function (12/26/14)   Status On-going   PT LONG TERM GOAL #4   Title report pain < 3/10 for improved function R UE (12/26/14)   Status On-going   PT LONG TERM GOAL #5   Title report ability to sleep through night without pain (12/25/13)   Status On-going               Plan - 12/12/14 1434    Clinical Impression Statement Pt demo improved Rt shoulder strength, yet still weak with ER, Flex and Abd.  Pt tolerated new exercises with red and green bands without increased pain.    Pt will benefit from skilled therapeutic intervention in order to improve on the following deficits Impaired UE functional use;Pain;Increased muscle spasms;Decreased strength;Decreased range of motion;Impaired flexibility;Improper body mechanics;Postural dysfunction;Decreased activity tolerance   Rehab Potential Good   PT Frequency 2x / week   PT Duration 6 weeks   PT Treatment/Interventions ADLs/Self Care Home Management;Electrical Stimulation;Cryotherapy;Functional mobility training;Neuromuscular re-education;Manual techniques;Ultrasound;Therapeutic exercise;Passive range of motion;Moist Heat;Therapeutic activities;Patient/family education   PT Next Visit Plan Continue progressive strengthening/stretching to Rt shoulder.    Consulted and Agree with Plan of Care Patient        Problem List Patient Active Problem List   Diagnosis Date Noted  . Right shoulder pain 10/29/2014  . S/P appendectomy 01/02/2014  . GERD (gastroesophageal reflux disease) 11/25/2013  . OAB (overactive bladder) 11/25/2013  . Depression 02/04/2012  . Diabetes mellitus  type II 02/04/2012  . Major depressive disorder, recurrent episode, moderate 09/15/2011  . Generalized anxiety disorder 09/15/2011  . BINGE EATING DISORDER 06/10/2008  . OBSTRUCTIVE SLEEP APNEA 06/10/2008  . MIGRAINE UNSP W/O INTRACT W/O STATUS MIGRAINOSUS 06/10/2008  . BENIGN POSITIONAL VERTIGO 06/10/2008  . ESSENTIAL HYPERTENSION, BENIGN 06/10/2008  . ALLERGIC RHINITIS 06/10/2008  . Diabetes type 2, uncontrolled 05/22/2008  . DEPRESSION/ANXIETY 05/22/2008  . TRANSAMINASES, SERUM, ELEVATED 05/22/2008   Mayer Camel, PTA 12/12/2014 3:39 PM   [facility] [place] [address] [place], [place], [address] Phone: [phone]   Fax:  [phone]

## 2014-12-12 NOTE — Patient Instructions (Signed)
  Low Row: Single Arm   Face anchor in stride stance. Palm up, pull arm back while squeezing shoulder blades together. Repeat 10__ times per set. Repeat with other arm. Do _2-3_ sets per session. Do 3__ sessions per week. Anchor Height: Waist  http://tub.exer.us/71   Copyright  VHI. All rights reserved.  Press: Thumb Up (Single Arm)   Face away from anchor in stride stance, leg forward opposite exercising arm. Press arm forward with thumb up. Repeat 10 times per set.  Do _2-3_ sets per session. Do _3_ sessions per week. Anchor Height: Chest  http://tub.exer.us/17   Copyright  VHI. All rights reserved.  Rotation: External (Single Arm)   Side toward anchor in shoulder width stance with elbow bent to 90, arm across mid-section. Thumb up, pull arm away from body, keeping elbow bent. Repeat _10_ times per set. Repeat with other arm. Do _2-3_ sets per session. Do _3_ sessions per week. Anchor Height: Waist  http://tub.exer.us/115  Closed Chain: Wall Push-Off   Stand ___ feet from wall. Fall toward wall, absorbing impact with arms. Immediately push back to start. Repeat ___ times per set. Rest ___ seconds after set. Do ___ sets per session.  http://plyo.exer.us/176   Copyright  VHI. All rights reserved.   Extension   Stand with hands clasped behind back. Extend arms out as far as possible. Hold __30__ seconds. Repeat __2__ times. Do __2__ sessions per day. Hold towel between hands for comfort.   Copyright  VHI. All rights reserved.   [facility] [address] [place], [place] [address]  [phone] (office) [phone] (fax)

## 2014-12-14 ENCOUNTER — Other Ambulatory Visit: Payer: Self-pay | Admitting: Physician Assistant

## 2014-12-19 ENCOUNTER — Ambulatory Visit (INDEPENDENT_AMBULATORY_CARE_PROVIDER_SITE_OTHER): Payer: Commercial Managed Care - PPO | Admitting: Physical Therapy

## 2014-12-19 DIAGNOSIS — M25611 Stiffness of right shoulder, not elsewhere classified: Secondary | ICD-10-CM

## 2014-12-19 DIAGNOSIS — M25511 Pain in right shoulder: Secondary | ICD-10-CM

## 2014-12-19 DIAGNOSIS — R29898 Other symptoms and signs involving the musculoskeletal system: Secondary | ICD-10-CM

## 2014-12-19 NOTE — Patient Instructions (Addendum)
Raise: Lateral with External Rotation (Single Arm)   Side toward anchor in wide stance, arm across body. Raise and bend arm to 90, rotating to palm forward. Repeat _10_ times per set. Repeat with other arm. Do _1-3_ sets per session. Do _3_ sessions per week. Anchor Height: Knee  http://tub.exer.us/103   Copyright  VHI. All rights reserved.  Pull Down: Diagonal (Single Arm)   Side toward anchor in shoulder width stance. Arm across body, thumb up, pull arm down and away. Repeat 10 times per set. Repeat with other arm. Do 1-3__ sets per session. Do _3_ sessions per week. Anchor Height: Over Head  http://tub.exer.us/111   Copyright  VHI. All rights reserved.  Rotation: Internal - Diagonal (Single Arm)   Side toward anchor in shoulder width stance. Palm forward, bring arm down and across body to palm back. Repeat 10_ times per set. Repeat with other arm. Do _1-3_ sets per session. Do _3_ sessions per week. Anchor Height: Over Head  http://tub.exer.us/129   Copyright  VHI. All rights reserved.  Rotation: External - Diagonal (Single Arm)   Side toward anchor in shoulder width stance, arm across body. Palm down, bring arm up and away, and rotate to thumb up. Elbow slightly bent at end position. Repeat 10__ times per set. Repeat with other arm. Do 1-3__ sets per session. Do _3_ sessions per week. Anchor Height: Knee  http://tub.exer.us/121   Copyright  VHI. All rights reserved.

## 2014-12-19 NOTE — Therapy (Addendum)
[place], [place], [address] Phone: [phone]   Fax:  [phone]  Physical Therapy Treatment  Patient Details  Name: Jennifer Conrad MRN: [id number] Date of Birth: 10-11-1960 Referring Provider:  Donella Stade, PA-C  Encounter Date: 12/19/2014      PT End of Session - 12/19/14 1403    Visit Number 7   Number of Visits 13   Date for PT Re-Evaluation 12/26/14   PT Start Time 6269   PT Stop Time 1453   PT Time Calculation (min) 50 min   Activity Tolerance Patient tolerated treatment well   Behavior During Therapy [facility] for tasks assessed/performed      Past Medical History  Diagnosis Date  . Obesity (BMI 30-39.9)     alowly losing weight/smaller portions/exercise 3 X week  . Depression   . Diabetes mellitus   . Hypertension     Past Surgical History  Procedure Laterality Date  . Abdominal hysterectomy    . Cholecystectomy    . Carpal tunnel release  10/2011  . Appendectomy      There were no vitals filed for this visit.  Visit Diagnosis:  Weakness of right upper extremity  Decreased right shoulder range of motion  Right shoulder pain      Subjective Assessment - 12/19/14 1404    Subjective Ever since the ultrasound I have been able to sleep through the night and minimal pain. Only taken meds once in past week.    Limitations Lifting;House hold activities   Patient Stated Goals sleep through night without pain or need for medication, improve pain and motion; perform household activities with decreased pain   Currently in Pain? No/denies            [facility] PT Assessment - 12/19/14 0001    AROM   Right Shoulder Flexion 148 Degrees  standing   Right Shoulder ABduction 125 Degrees  standing   Right Shoulder External Rotation 90 Degrees  in standing   Strength   Right Shoulder Flexion 5/5   Right Shoulder Extension 5/5   Right Shoulder ABduction 4/5   Right Shoulder  Internal Rotation 5/5   Right Shoulder External Rotation --  5-/5   Right Elbow Flexion 5/5   Right Elbow Extension 5/5                   [facility] Adult PT Treatment/Exercise - 12/19/14 0001    Shoulder Exercises: Standing   External Rotation Strengthening;Right;20 reps   Theraband Level (Shoulder External Rotation) Level 3 (Green)   Other Standing Exercises PNF D1/D2 green band x 15 (D1 flex and D2 ext red band)   Shoulder Exercises: ROM/Strengthening   UBE (Upper Arm Bike) L3 2'/2'   Other ROM/Strengthening Exercises Wall walks x 10, then 2 20 seconds stretch ; flex and abd   Ultrasound   Ultrasound Location Rt biceps belly   Ultrasound Parameters 8 min 1.5wcm2 1 mhz cont   Ultrasound Goals Pain  pain after exercise                PT Education - 12/19/14 1735    Education provided Yes   Education Details hep pnf band   Person(s) Educated Patient   Methods Explanation;Demonstration;Handout   Comprehension Verbalized understanding;Returned demonstration          PT Short Term Goals - 12/19/14 1406    PT SHORT TERM GOAL #1   Title independent with initial HEP (  12/05/14)   Time 3   Period Weeks   Status Achieved   PT SHORT TERM GOAL #2   Title report pain < 4/10 for improved function and activity (12/05/14)  average pain 1/10 in past week   Time 3   Period Weeks   Status Achieved   PT SHORT TERM GOAL #3   Title improve AROM R shoulder by at least 5 degrees limited all motions for improved mobility (12/05/14)   Time 3   Period Weeks   Status Achieved           PT Long Term Goals - 12/19/14 1407    PT LONG TERM GOAL #1   Title independent with advanced HEP (12/26/14)   Time 6   Period Weeks   Status On-going   PT LONG TERM GOAL #2   Title verbalize understanding of posture/body mechanics/lifting to decrease risk of reinjury (12/26/14)   Time 6   Period Weeks   Status On-going   PT LONG TERM GOAL #3   Title improve RUE shoulder ROM by 15  degrees for improved function (12/26/14)   Time 6   Period Weeks   Status Achieved   PT LONG TERM GOAL #4   Title report pain < 3/10 for improved function R UE (12/26/14)   Time 6   Period Weeks   Status Achieved   PT LONG TERM GOAL #5   Title report ability to sleep through night without pain (12/25/13)   Time 6   Period Weeks   Status Achieved   Additional Long Term Goals   Additional Long Term Goals --               Plan - 12/19/14 1744    Clinical Impression Statement Patient presents with significant improvements in strength and pain. She has met ROM, strength and pain goals and is progressing well toward other LTGs.   Pt will benefit from skilled therapeutic intervention in order to improve on the following deficits Impaired UE functional use;Pain;Increased muscle spasms;Decreased strength;Decreased range of motion;Impaired flexibility;Improper body mechanics;Postural dysfunction;Decreased activity tolerance   Rehab Potential Excellent   PT Frequency 2x / week   PT Duration 6 weeks   PT Treatment/Interventions ADLs/Self Care Home Management;Electrical Stimulation;Cryotherapy;Functional mobility training;Neuromuscular re-education;Manual techniques;Ultrasound;Therapeutic exercise;Passive range of motion;Moist Heat;Therapeutic activities;Patient/family education   PT Next Visit Plan Continue one more week then d/c to hep   Consulted and Agree with Plan of Care Patient        Problem List Patient Active Problem List   Diagnosis Date Noted  . Right shoulder pain 10/29/2014  . S/P appendectomy 01/02/2014  . GERD (gastroesophageal reflux disease) 11/25/2013  . OAB (overactive bladder) 11/25/2013  . Depression 02/04/2012  . Diabetes mellitus type II 02/04/2012  . Major depressive disorder, recurrent episode, moderate 09/15/2011  . Generalized anxiety disorder 09/15/2011  . BINGE EATING DISORDER 06/10/2008  . OBSTRUCTIVE SLEEP APNEA 06/10/2008  . MIGRAINE UNSP W/O  INTRACT W/O STATUS MIGRAINOSUS 06/10/2008  . BENIGN POSITIONAL VERTIGO 06/10/2008  . ESSENTIAL HYPERTENSION, BENIGN 06/10/2008  . ALLERGIC RHINITIS 06/10/2008  . Diabetes type 2, uncontrolled 05/22/2008  . DEPRESSION/ANXIETY 05/22/2008  . TRANSAMINASES, SERUM, ELEVATED 05/22/2008   Einer Meals PT 12/19/2014, 5:49 PM  [facility] [place], [place], [address] Phone: [phone]   Fax:  [phone]     PHYSICAL THERAPY DISCHARGE SUMMARY  Visits from Start of Care: 7  Current functional level related to goals / functional  outcomes: unknown   Remaining deficits: unknown   Education / Equipment: HEP Plan:                                                    Patient goals were partially met. Patient is being discharged due to not returning since the last visit.  ?????   Jeral Pinch, PT 01/17/2015 8:49 AM

## 2014-12-30 ENCOUNTER — Encounter: Payer: Self-pay | Admitting: Physician Assistant

## 2015-01-28 ENCOUNTER — Other Ambulatory Visit: Payer: Self-pay | Admitting: Physician Assistant

## 2015-02-08 ENCOUNTER — Other Ambulatory Visit: Payer: Self-pay | Admitting: Physician Assistant

## 2015-02-17 ENCOUNTER — Other Ambulatory Visit: Payer: Self-pay | Admitting: Physician Assistant

## 2015-03-22 ENCOUNTER — Other Ambulatory Visit: Payer: Self-pay | Admitting: Physician Assistant

## 2015-04-22 ENCOUNTER — Other Ambulatory Visit: Payer: Self-pay | Admitting: Physician Assistant

## 2015-06-08 ENCOUNTER — Other Ambulatory Visit: Payer: Self-pay | Admitting: Physician Assistant

## 2015-06-11 NOTE — Telephone Encounter (Signed)
Left voicemail for Pt advising she was due for a follow up appointment.

## 2015-08-06 ENCOUNTER — Other Ambulatory Visit: Payer: Self-pay | Admitting: Physician Assistant

## 2015-08-29 ENCOUNTER — Other Ambulatory Visit: Payer: Self-pay | Admitting: Physician Assistant

## 2015-09-01 ENCOUNTER — Other Ambulatory Visit: Payer: Self-pay | Admitting: Physician Assistant

## 2015-09-21 ENCOUNTER — Emergency Department
Admission: EM | Admit: 2015-09-21 | Discharge: 2015-09-21 | Disposition: A | Discharge status: Home / Self Care | Payer: Commercial Managed Care - PPO | Source: Home / Self Care | Attending: Family Medicine | Admitting: Family Medicine

## 2015-09-21 ENCOUNTER — Encounter: Payer: Self-pay | Admitting: Emergency Medicine

## 2015-09-21 ENCOUNTER — Other Ambulatory Visit: Payer: Self-pay | Admitting: Emergency Medicine

## 2015-09-21 ENCOUNTER — Other Ambulatory Visit: Payer: Self-pay | Admitting: Physician Assistant

## 2015-09-21 DIAGNOSIS — J029 Acute pharyngitis, unspecified: Secondary | ICD-10-CM | POA: Diagnosis not present

## 2015-09-21 DIAGNOSIS — R51 Headache: Secondary | ICD-10-CM

## 2015-09-21 DIAGNOSIS — R519 Headache, unspecified: Secondary | ICD-10-CM

## 2015-09-21 LAB — POCT RAPID STREP A (OFFICE): Rapid Strep A Screen: NEGATIVE

## 2015-09-21 NOTE — ED Provider Notes (Signed)
CSN: [id number]     Arrival date & time 09/21/15  1322 History   First MD Initiated Contact with Patient 09/21/15 1324     Chief Complaint  Patient presents with  . Sore Throat   (Consider location/radiation/quality/duration/timing/severity/associated sxs/prior Treatment) HPI Pt is a 55yo female presenting to [facility] with c/o gradually worsening sore throat with associated generalized headaches for 3 days.  She also reports subjective fever. She has been taking ibuprofen with minimal relief of symptoms.  Denies n/v/d. Denies cough or congestion.  Pt concerned she has strep throat. She is able to keep down fluids but it hurts to swallow.     Past Medical History  Diagnosis Date  . Obesity (BMI 30-39.9)     alowly losing weight/smaller portions/exercise 3 X week  . Depression   . Diabetes mellitus   . Hypertension    Past Surgical History  Procedure Laterality Date  . Abdominal hysterectomy    . Cholecystectomy    . Carpal tunnel release  10/2011  . Appendectomy     Family History  Problem Relation Age of Onset  . Depression Sister   . Drug abuse Cousin   . Depression Sister   . Diabetes Mother   . Cancer Mother   . Cancer Father    Social History  Substance Use Topics  . Smoking status: Never Smoker   . Smokeless tobacco: Never Used  . Alcohol Use: No   OB History    No data available     Review of Systems  Constitutional: Positive for fever and chills.  HENT: Positive for sneezing and sore throat. Negative for congestion, ear pain, trouble swallowing and voice change.   Respiratory: Negative for cough and shortness of breath.   Cardiovascular: Negative for chest pain and palpitations.  Gastrointestinal: Negative for nausea, vomiting, abdominal pain and diarrhea.  Musculoskeletal: Negative for myalgias, back pain and arthralgias.  Skin: Negative for rash.  Neurological: Positive for headaches. Negative for dizziness and light-headedness.    Allergies  Ace  inhibitors  Home Medications   Prior to Admission medications   Medication Sig Start Date End Date Taking? Authorizing Provider  AMBULATORY NON FORMULARY MEDICATION Medication Name: One touch ultra mini strips  To test blood sugar three times a day. 06/30/12   Jade L Breeback, PA-C  atenolol (TENORMIN) 25 MG tablet TAKE 1 TABLET BY MOUTH 2 TIMES DAILY. SCHEDULE APPOINTMENT FOR FURTHER REFILLS 08/29/15   Jade L Breeback, PA-C  buPROPion (WELLBUTRIN XL) 150 MG 24 hr tablet Take 2 tablets (300 mg total) by mouth daily. Patient needs to schedule a follow up appointment before more refills. 06/11/15   Jade L Breeback, PA-C  canagliflozin (INVOKANA) 100 MG TABS tablet Take 1 tablet by mouth every day **OFFICE APPOINTMENT NEEDED FOR REFILLS** 08/27/14   Jade L Breeback, PA-C  escitalopram (LEXAPRO) 20 MG tablet TAKE 1 TABLET BY MOUTH DAILY 04/24/15   Jomarie Longs, PA-C  HYDROcodone-acetaminophen (NORCO/VICODIN) 5-325 MG per tablet Take one by mouth at bedtime as needed for pain 11/18/14   Lattie Haw, MD  metFORMIN (GLUCOPHAGE) 1000 MG tablet TAKE 1 TABLET (1,000 MG TOTAL) BY MOUTH 3 (THREE) TIMES DAILY WITH MEALS. 04/24/15   Jade L Breeback, PA-C  metFORMIN (GLUCOPHAGE) 1000 MG tablet Take 1 tablet (1,000 mg total) by mouth 3 (three) times daily with meals. 06/11/15   Jomarie Longs, PA-C  Olmesartan-Amlodipine-HCTZ (TRIBENZOR) 40-10-25 MG TABS Take 1 tablet by mouth daily 09/04/14   Jomarie Longs,  PA-C  oxybutynin (DITROPAN-XL) 10 MG 24 hr tablet TAKE 1 TABLET (10 MG TOTAL) BY MOUTH DAILY. 09/17/14   Jade L Breeback, PA-C  pantoprazole (PROTONIX) 40 MG tablet Take 1 tablet (40 mg total) by mouth daily. 10/26/14   Jade L Breeback, PA-C  traMADol (ULTRAM) 50 MG tablet Take 1 tablet (50 mg total) by mouth every 6 (six) hours as needed for pain. 02/19/13   Floydene FlockSteven J Newton, MD  triamcinolone ointment (KENALOG) 0.5 % Apply 1 application topically 2 (two) times daily. 08/14/13   Jomarie LongsJade L Breeback, PA-C    Meds Ordered and Administered this Visit  Medications - No data to display  BP 175/95 mmHg  Pulse 95  Temp(Src) 97.9 F (36.6 C) (Oral)  Resp 18  Ht 5\' 2"  (1.575 m)  Wt 212 lb 8 oz (96.389 kg)  BMI 38.86 kg/m2  SpO2 95% No data found.   Physical Exam  Constitutional: She appears well-developed and well-nourished. No distress.  HENT:  Head: Normocephalic and atraumatic.  Right Ear: Hearing, tympanic membrane, external ear and ear canal normal.  Left Ear: Hearing, tympanic membrane, external ear and ear canal normal.  Nose: Mucosal edema present. Right sinus exhibits no maxillary sinus tenderness and no frontal sinus tenderness. Left sinus exhibits no maxillary sinus tenderness and no frontal sinus tenderness.  Mouth/Throat: Uvula is midline and mucous membranes are normal. Posterior oropharyngeal edema and posterior oropharyngeal erythema present. No oropharyngeal exudate or tonsillar abscesses.  Eyes: Conjunctivae are normal. No scleral icterus.  Neck: Normal range of motion. Neck supple.  Cardiovascular: Normal rate, regular rhythm and normal heart sounds.   Pulmonary/Chest: Effort normal and breath sounds normal. No stridor. No respiratory distress. She has no wheezes. She has no rales. She exhibits no tenderness.  Abdominal: Soft. She exhibits no distension. There is no tenderness.  Musculoskeletal: Normal range of motion.  Lymphadenopathy:    She has no cervical adenopathy.  Neurological: She is alert.  Skin: Skin is warm and dry. She is not diaphoretic.  Nursing note and vitals reviewed.   ED Course  Procedures (including critical care time)  Labs Review Labs Reviewed  CULTURE, GROUP A STREP [facility])  POCT RAPID STREP A (OFFICE)    Imaging Review No results found.    MDM   1. Acute pharyngitis, unspecified etiology   2. Generalized headache     Pt c/o sore throat and headache. No evidence of peritonsillar abscess.  Rapid strep: negative. Culture  sent.  Advised pt to use acetaminophen and ibuprofen as needed for fever and pain. Encouraged rest and fluids. F/u with PCP in 7-10 days if not improving, sooner if worsening. Pt verbalized understanding and agreement with tx plan.     Junius Finnerrin O'Malley, PA-C 09/21/15 1429

## 2015-09-21 NOTE — Discharge Instructions (Signed)
You may take 400-600mg  Ibuprofen (Motrin) every 6-8 hours for fever and pain  Alternate with Tylenol  You may take 500mg  Tylenol every 4-6 hours as needed for fever and pain  Warm saltwater gargles are also good to help decrease swelling and pain. Follow-up with your primary care provider next week for recheck of symptoms if not improving.  Be sure to drink plenty of fluids and rest, at least 8hrs of sleep a night, preferably more while you are sick. Return urgent care or go to closest ER if you cannot keep down fluids/signs of dehydration, fever not reducing with Tylenol, difficulty breathing/wheezing, stiff neck, worsening condition, or other concerns (see below)

## 2015-09-21 NOTE — ED Notes (Signed)
[facility]/O sore throat times 3 days patient states she has had fever and headache.

## 2015-09-22 ENCOUNTER — Ambulatory Visit: Payer: Commercial Managed Care - PPO | Admitting: Rehabilitative and Restorative Service Providers"

## 2015-09-23 ENCOUNTER — Telehealth: Payer: Self-pay | Admitting: Emergency Medicine

## 2015-09-23 LAB — CULTURE, GROUP A STREP

## 2015-10-01 ENCOUNTER — Other Ambulatory Visit: Payer: Self-pay | Admitting: Physician Assistant

## 2015-10-13 ENCOUNTER — Other Ambulatory Visit: Payer: Self-pay | Admitting: Physician Assistant

## 2015-10-27 ENCOUNTER — Emergency Department
Admission: EM | Admit: 2015-10-27 | Discharge: 2015-10-27 | Disposition: A | Discharge status: Home / Self Care | Payer: Self-pay | Source: Home / Self Care

## 2015-10-27 MED ORDER — TUBERCULIN PPD 5 UNIT/0.1ML ID SOLN
5.0000 [IU] | Freq: Once | INTRADERMAL | Status: DC
  Administered 2015-10-27: 5 [IU] via INTRADERMAL

## 2015-10-27 NOTE — ED Notes (Signed)
The pt is here today for PPD placement

## 2015-10-29 ENCOUNTER — Emergency Department
Admission: EM | Admit: 2015-10-29 | Discharge: 2015-10-29 | Disposition: A | Discharge status: Home / Self Care | Payer: Self-pay | Source: Home / Self Care

## 2015-10-29 NOTE — ED Notes (Signed)
PPD read 00mm, negative. Clemens Catholic, LPN

## 2015-10-30 ENCOUNTER — Ambulatory Visit: Payer: Commercial Managed Care - PPO | Admitting: Rehabilitative and Restorative Service Providers"

## 2015-10-31 ENCOUNTER — Telehealth: Payer: Self-pay

## 2015-11-03 ENCOUNTER — Encounter: Payer: Self-pay | Admitting: Rehabilitative and Restorative Service Providers"

## 2015-11-03 ENCOUNTER — Ambulatory Visit (INDEPENDENT_AMBULATORY_CARE_PROVIDER_SITE_OTHER): Payer: Commercial Managed Care - PPO | Admitting: Rehabilitative and Restorative Service Providers"

## 2015-11-03 DIAGNOSIS — Z7409 Other reduced mobility: Secondary | ICD-10-CM

## 2015-11-03 DIAGNOSIS — M25611 Stiffness of right shoulder, not elsewhere classified: Secondary | ICD-10-CM

## 2015-11-03 DIAGNOSIS — M25511 Pain in right shoulder: Secondary | ICD-10-CM

## 2015-11-03 DIAGNOSIS — R531 Weakness: Secondary | ICD-10-CM

## 2015-11-03 DIAGNOSIS — R29898 Other symptoms and signs involving the musculoskeletal system: Secondary | ICD-10-CM

## 2015-11-03 DIAGNOSIS — R6889 Other general symptoms and signs: Secondary | ICD-10-CM

## 2015-11-03 NOTE — Patient Instructions (Signed)
Self massage using ~4 inch rubber ball  Work on posture and alignment - lift chest dropping shoulders down and back   Axial Extension (Chin Tuck)    Pull chin in and lengthen back of neck. Hold __10-15__ seconds while counting out loud. Repeat _5-10 ___ times. Do __several __ sessions per day.  Shoulder Blade Squeeze   Can use swim noodle along spine to help with posture  Rotate shoulders back, then squeeze shoulder blades down and back . Hold 10 sec Repeat __10__ times. Do _several ___ sessions per day.  Flexion (Eccentric) - Active-Assist (Pulley)    With hands lightly holding pulley, raise affected arm with other arm  Avoid hiking shoulder. Hold 10 sec  _10__ reps per set, __3-4_ sets per day   ROM: Flexion    Keeping left arm on table, slide body away until stretch is felt. Hold __10__ seconds. Repeat __5-10__ times per set.  Do _3-4___ sessions per day. Can do in standing   Chest / Shoulder Stretch: Yardstick Along Spine    Sostenga el bastn horizontalmente a al altura de los glteos, brazos separados al ancho de los hombros. Doble los codos, y levante el bastn a lo largo de la columna. Sostenga por la cuenta de 1. Lentamente regrese a la posicin inicial. Repita __5-10_ veces.    ROM: Towel Stretch - with Interior Rotation    Pull left arm up behind back by pulling towel up with other arm. Hold _10___ seconds. Repeat _3___ times per set. Do _3-4___ sessions per day.
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
## 2015-11-07 ENCOUNTER — Other Ambulatory Visit: Payer: Self-pay | Admitting: Physician Assistant

## 2015-11-07 ENCOUNTER — Encounter: Payer: Self-pay | Admitting: Rehabilitative and Restorative Service Providers"

## 2015-11-10 ENCOUNTER — Encounter: Payer: Self-pay | Admitting: Rehabilitative and Restorative Service Providers"

## 2015-11-10 ENCOUNTER — Telehealth: Payer: Self-pay | Admitting: Physician Assistant

## 2015-11-10 ENCOUNTER — Ambulatory Visit (INDEPENDENT_AMBULATORY_CARE_PROVIDER_SITE_OTHER): Payer: Commercial Managed Care - PPO | Admitting: Rehabilitative and Restorative Service Providers"

## 2015-11-10 DIAGNOSIS — M25511 Pain in right shoulder: Secondary | ICD-10-CM | POA: Diagnosis not present

## 2015-11-10 DIAGNOSIS — R29898 Other symptoms and signs involving the musculoskeletal system: Secondary | ICD-10-CM | POA: Diagnosis not present

## 2015-11-10 DIAGNOSIS — R6889 Other general symptoms and signs: Secondary | ICD-10-CM

## 2015-11-10 DIAGNOSIS — R531 Weakness: Secondary | ICD-10-CM

## 2015-11-10 DIAGNOSIS — Z7409 Other reduced mobility: Secondary | ICD-10-CM | POA: Diagnosis not present

## 2015-11-10 DIAGNOSIS — M25611 Stiffness of right shoulder, not elsewhere classified: Secondary | ICD-10-CM

## 2015-11-10 NOTE — Telephone Encounter (Signed)
I called and left a voicemail for patient to call back and schedule a follow up appointment -  CF
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
## 2015-11-10 NOTE — Patient Instructions (Addendum)
Active Assistive Shoulder External Rotation    Stand with stick at waist level, thumb up. Push forearm out from body and keep elbows bent. Hold 10 sec.  Perform _10  reps.   Scapula Adduction With Pectoralis Stretch: Low - Standing   Shoulders at 45 hands even with shoulders, keeping weight through legs, shift weight forward until you feel pull or stretch through the front of your chest. Hold _30__ seconds. Do _3__ times, _2-4__ times per day.   Scapula Adduction With Pectoralis Stretch: Mid-Range - Standing   Shoulders at 90 elbows even with shoulders, keeping weight through legs, shift weight forward until you feel pull or strength through the front of your chest. Hold __30_ seconds. Do _3__ times, __2-4_ times per day.   Scapula Adduction With Pectoralis Stretch: High - Standing   Shoulders at 120 hands up high on the doorway, keeping weight on feet, shift weight forward until you feel pull or stretch through the front of your chest. Hold _30__ seconds. Do _3__ times, _2-3__ times per day.  Snow angel 1-5 min  Lying with hands behind head 1-3 min      TENS UNIT: This is helpful for muscle pain and spasm.   Search and Purchase a TENS 7000 2nd edition at www.tenspros.com. It should be less than $30.     TENS unit instructions: Do not shower or bathe with the unit on Turn the unit off before removing electrodes or batteries If the electrodes lose stickiness add a drop of water to the electrodes after they are disconnected from the unit and place on plastic sheet. If you continued to have difficulty, call the TENS unit company to purchase more electrodes. Do not apply lotion on the skin area prior to use. Make sure the skin is clean and dry as this will help prolong the life of the electrodes. After use, always check skin for unusual red areas, rash or other skin difficulties. If there are any skin problems, does not apply electrodes to the same area. Never remove  the electrodes from the unit by pulling the wires. Do not use the TENS unit or electrodes other than as directed. Do not change electrode placement without consultating your therapist or physician. Keep 2 fingers with between each electrode.

## 2015-11-13 ENCOUNTER — Encounter: Payer: Self-pay | Admitting: Physical Therapy

## 2015-11-19 ENCOUNTER — Other Ambulatory Visit: Payer: Self-pay | Admitting: Physician Assistant

## 2015-11-19 ENCOUNTER — Ambulatory Visit (INDEPENDENT_AMBULATORY_CARE_PROVIDER_SITE_OTHER): Payer: Commercial Managed Care - PPO | Admitting: Physical Therapy

## 2015-11-19 DIAGNOSIS — Z7409 Other reduced mobility: Secondary | ICD-10-CM

## 2015-11-19 DIAGNOSIS — M25511 Pain in right shoulder: Secondary | ICD-10-CM

## 2015-11-19 DIAGNOSIS — R29898 Other symptoms and signs involving the musculoskeletal system: Secondary | ICD-10-CM

## 2015-11-19 DIAGNOSIS — M25611 Stiffness of right shoulder, not elsewhere classified: Secondary | ICD-10-CM

## 2015-11-19 DIAGNOSIS — R6889 Other general symptoms and signs: Secondary | ICD-10-CM

## 2015-11-19 DIAGNOSIS — R531 Weakness: Secondary | ICD-10-CM

## 2015-11-19 NOTE — Therapy (Signed)
[facility] [address] [place], [place], [address] Phone: [phone]   Fax:  [phone]  Physical Therapy Treatment  Patient Details  Name: Jennifer JordanWendy I Conrad MRN: [id number] Date of Birth: 04/17/1961 Referring Provider: Dr. Hoyt KochJeffrey henderson   Encounter Date: 11/19/2015      PT End of Session - 11/19/15 1611    Visit Number 3   Number of Visits 12   Date for PT Re-Evaluation 12/15/15   PT Start Time 1602   PT Stop Time 1657   PT Time Calculation (min) 55 min   Activity Tolerance Patient tolerated treatment well      Past Medical History  Diagnosis Date  . Obesity (BMI 30-39.9)     alowly losing weight/smaller portions/exercise 3 X week  . Depression   . Diabetes mellitus   . Hypertension     Past Surgical History  Procedure Laterality Date  . Abdominal hysterectomy    . Cholecystectomy    . Carpal tunnel release  10/2011  . Appendectomy      There were no vitals filed for this visit.  Visit Diagnosis:  Weakness of right upper extremity  Decreased right shoulder range of motion  Right shoulder pain  Decreased strength, endurance, and mobility      Subjective Assessment - 11/19/15 1608    Subjective Pt [place] [facility] [place] scrapbook[place] (4 days); hadn't been exercising. She reports she woke up on her Rt side; feels it has increased pain from this.     Currently in Pain? Yes   Pain Score 6    Pain Location Shoulder   Pain Orientation Right   Pain Descriptors / Indicators Aching   Aggravating Factors  lying on Rt side, reaching behind back   Pain Relieving Factors medicine, ice            [facility] PT Assessment - 11/19/15 0001    AROM   Right Shoulder Extension 57 Degrees   Right Shoulder Flexion 127 Degrees  standing   Right Shoulder ABduction 122 Degrees  standing   Right Shoulder External Rotation 55 Degrees  supine with shoulder abd 80 deg          [facility] Adult PT  Treatment/Exercise - 11/19/15 0001    Shoulder Exercises: Supine   External Rotation AAROM;10 reps;Right  10 sec, with cane   Flexion AAROM;Right  8 reps with 5 sec hold, with cane   Shoulder Exercises: Standing   External Rotation Strengthening;Right;10 reps;Theraband   Theraband Level (Shoulder External Rotation) Level 1 (Yellow)   Internal Rotation Strengthening;Right;10 reps;Theraband   Theraband Level (Shoulder Internal Rotation) Level 1 (Yellow)   Flexion Strengthening;Right;10 reps;Theraband   Theraband Level (Shoulder Flexion) Level 1 (Yellow)   Row Strengthening;Right;10 reps;Theraband   Theraband Level (Shoulder Row) Level 1 (Yellow)   Other Standing Exercises shoulder ext stretch x 15 sec x 2 reps    Shoulder Exercises: Pulleys   Flexion --  10 sec hold x 10   ABduction --  10 sec hold x 10   Shoulder Exercises: Stretch   Other Shoulder Stretches doorway stretch low and mid level position x 20 sec x 2 reps each position   Electrical Stimulation   Electrical Stimulation Location Rt shoulder    Electrical Stimulation Action IFC   Electrical Stimulation Parameters to tolerance    Electrical Stimulation Goals Tone;Pain   Vasopneumatic   Number Minutes Vasopneumatic  15 minutes   Vasopnuematic Location  Shoulder  Vasopneumatic Pressure Medium   Vasopneumatic Temperature  3*                     PT Long Term Goals - 11/10/15 1249    PT LONG TERM GOAL #1   Title improve posture and alignment with pt to demo improved posterior shoulder girdle control 12/15/15   Time 6   Period Weeks   Status On-going   PT LONG TERM GOAL #2   Title Increase Rt shoulder ROM to equal Lt shoulder ROM 12/15/15   Time 6   Period Weeks   Status On-going   PT LONG TERM GOAL #3   Title 5/5 strength Rt shoulder    Time 6   Period Weeks   Status On-going   PT LONG TERM GOAL #4   Title Patient reports ability to rest and sleep without awakening due to Rt shoulder pain 12/15/15    Time 6   Period Weeks   Status On-going   PT LONG TERM GOAL #5   Title I in HEP 12/15/15   Time 6   Period Weeks   Status On-going   PT LONG TERM GOAL #6   Title Improve FOTO to </= 36 % limitation 12/15/15   Time 6   Period Weeks   Status On-going               Plan - 11/19/15 1658    Clinical Impression Statement Pt demonstrated slight improvement in ROM of Rt shoulder. Pt tolerated all new exercises with mild increase in pain. Pt reported decrease in pain in shoulder with use of estim and vaso at end of session.  Progressing towards goals.    Pt will benefit from skilled therapeutic intervention in order to improve on the following deficits Postural dysfunction;Improper body mechanics;Pain;Decreased range of motion;Decreased mobility;Decreased strength;Decreased endurance;Decreased activity tolerance;Increased fascial restricitons   Rehab Potential Good   PT Frequency 2x / week   PT Duration 6 weeks   PT Treatment/Interventions Patient/family education;ADLs/Self Care Home Management;Therapeutic exercise;Therapeutic activities;Manual techniques;Neuromuscular re-education;Dry needling;Cryotherapy;Electrical Stimulation;Iontophoresis /ml Dexamethasone;Moist Heat;Ultrasound   PT Next Visit Plan cont pec stretch; cont to work on ROM; add manual work through shoulder including soft tissue and mobilzation; progress exercise as indicated; TDN as indicated    Consulted and Agree with Plan of Care Patient        Problem List Patient Active Problem List   Diagnosis Date Noted  . Right shoulder pain 10/29/2014  . S/P appendectomy 01/02/2014  . GERD (gastroesophageal reflux disease) 11/25/2013  . OAB (overactive bladder) 11/25/2013  . Depression 02/04/2012  . Diabetes mellitus type II 02/04/2012  . Major depressive disorder, recurrent episode, moderate (HCC) 09/15/2011  . Generalized anxiety disorder 09/15/2011  . BINGE EATING DISORDER 06/10/2008  . OBSTRUCTIVE SLEEP APNEA  06/10/2008  . MIGRAINE UNSP W/O INTRACT W/O STATUS MIGRAINOSUS 06/10/2008  . BENIGN POSITIONAL VERTIGO 06/10/2008  . ESSENTIAL HYPERTENSION, BENIGN 06/10/2008  . ALLERGIC RHINITIS 06/10/2008  . Diabetes type 2, uncontrolled (HCC) 05/22/2008  . DEPRESSION/ANXIETY 05/22/2008  . TRANSAMINASES, SERUM, ELEVATED 05/22/2008    Mayer Camel, PTA 11/19/2015 5:03 PM  [facility] [place] [address] [place], [place], [address] Phone: [phone]   Fax:  [phone]  Name: Jennifer Conrad MRN: [id number] Date of Birth: February 07, 1961

## 2015-11-19 NOTE — Patient Instructions (Signed)
  Low Row: Single Arm   Face anchor in stride stance. Palm up, pull arm back while squeezing shoulder blades together. Repeat 10__ times per set. Repeat with other arm. Do _2-3_ sets per session. Do 3__ sessions per week. Anchor Height: Waist   Press: Thumb Up (Single Arm)   Face away from anchor in stride stance, leg forward opposite exercising arm. Press arm forward with thumb up. Repeat 10 times per set.  Do _2-3_ sets per session. Do _3_ sessions per week. Anchor Height: Chest  [email or web address]   Rotation: External (Single Arm)   Side toward anchor in shoulder width stance with elbow bent to 90, arm across mid-section. Thumb up, pull arm away from body, keeping elbow bent. Repeat _10_ times per set. Repeat with other arm. Do _2-3_ sets per session. Do _3_ sessions per week. Anchor Height: Waist  [email or web address]   Copyright  VHI. All rights reserved.  Rotation: Internal (Single Arm)   Side toward anchor in shoulder width stance with elbow bent to 90, forearm away from body. Thumb up, pull arm across body keeping elbow bent. Repeat _10_ times per set. Repeat with other arm. Do _2-3_ sets per session. Do _3_ sessions per week. Anchor Height: Waist  [email or web address]  SHOULDER: External Rotation - Supine (Cane)    Hold cane with both hands. Rotate arm away from body. Keep elbow on floor and next to body. __10_ reps per set, __1-2_ sets per day, ___ days per week Add towel to keep elbow at side.5  Cane Exercise: Flexion    Lie on back, holding cane above chest. Keeping arms as straight as possible, lower cane toward floor beyond head. Hold __10__ seconds. Repeat __10__ times. Do __1-2__ sessions per day.  [email or web address]   [facility] [address] [place], [place] [address]  [phone] (office) [phone] (fax)

## 2015-11-24 ENCOUNTER — Ambulatory Visit (INDEPENDENT_AMBULATORY_CARE_PROVIDER_SITE_OTHER): Payer: Commercial Managed Care - PPO | Admitting: Physical Therapy

## 2015-11-24 DIAGNOSIS — R6889 Other general symptoms and signs: Secondary | ICD-10-CM

## 2015-11-24 DIAGNOSIS — Z7409 Other reduced mobility: Secondary | ICD-10-CM

## 2015-11-24 DIAGNOSIS — M25511 Pain in right shoulder: Secondary | ICD-10-CM

## 2015-11-24 DIAGNOSIS — R531 Weakness: Secondary | ICD-10-CM

## 2015-11-24 DIAGNOSIS — M25611 Stiffness of right shoulder, not elsewhere classified: Secondary | ICD-10-CM

## 2015-11-24 DIAGNOSIS — R29898 Other symptoms and signs involving the musculoskeletal system: Secondary | ICD-10-CM

## 2015-11-24 NOTE — Patient Instructions (Signed)
Over Head Pull: Narrow Grip     [place] [phone]   On back, knees bent, feet flat, band across thighs, elbows straight but relaxed. Pull hands apart (start). Keeping elbows straight, bring arms up and over head, hands toward floor. Keep pull steady on band. Hold momentarily. Return slowly, keeping pull steady, back to start. Repeat _10__ times. Band color _yellow_____   Side Pull: Double Arm   On back, knees bent, feet flat. Arms perpendicular to body, shoulder level, elbows straight but relaxed. Pull arms out to sides, elbows straight. Resistance band comes across collarbones, hands toward floor. Hold momentarily. Slowly return to starting position. Repeat _10__ times. Band color __yellow___   Sash   On back, knees bent, feet flat, left hand on left hip, right hand above left. Pull right arm DIAGONALLY (hip to shoulder) across chest. Bring right arm along head toward floor. Hold momentarily. Slowly return to starting position. Repeat _10__ times. Do with left arm. Band color __yellow____   Shoulder Rotation: Double Arm   On back, knees bent, feet flat, elbows tucked at sides, bent 90, hands palms up. Pull hands apart and down toward floor, keeping elbows near sides. Hold momentarily. Slowly return to starting position. Repeat _10__ times, 2 sets. Band color __yellow____    

## 2015-11-24 NOTE — Therapy (Addendum)
[place], [place], [address] Phone: [phone]   Fax:  [phone]  Physical Therapy Treatment  Patient Details  Name: Jennifer Conrad MRN: [id number] Date of Birth: [birth year] Referring Provider: Dr. Koleen Nimrod  Encounter Date: 11/24/2015      PT End of Session - 11/24/15 0942    Visit Number 4   Number of Visits 12   Date for PT Re-Evaluation 12/15/15   PT Start Time 0936   PT Stop Time 1030   PT Time Calculation (min) 54 min      Past Medical History  Diagnosis Date  . Obesity (BMI 30-39.9)     alowly losing weight/smaller portions/exercise 3 X week  . Depression   . Diabetes mellitus   . Hypertension     Past Surgical History  Procedure Laterality Date  . Abdominal hysterectomy    . Cholecystectomy    . Carpal tunnel release  10/2011  . Appendectomy      There were no vitals filed for this visit.  Visit Diagnosis:  Weakness of right upper extremity  Decreased right shoulder range of motion  Right shoulder pain  Decreased strength, endurance, and mobility      Subjective Assessment - 11/24/15 0942    Subjective Pt has been helping with house remodel project (mudding, sweeping).  Feels her Rt shoulder has muscular soreness; most her pain is at night (achy).  Pt reports she can reach with greater ease.    Currently in Pain? Yes   Pain Score 1    Pain Location Shoulder   Pain Orientation Right   Pain Descriptors / Indicators Aching   Aggravating Factors  lying on Rt side, reaching behind back     Pain Relieving Factors medicine, ice             [facility] PT Assessment - 11/24/15 0001    Assessment   Medical Diagnosis Pain in the Rt shoulder    Referring Provider Dr. Koleen Nimrod   Onset Date/Surgical Date 07/04/15   Hand Dominance Right   Next MD Visit PRN   ROM / Strength   AROM / PROM / Strength AROM;PROM;Strength   AROM   AROM Assessment Site Shoulder    Right/Left Shoulder Right   Right Shoulder Flexion 127 Degrees  standing, 140 supine   Right Shoulder External Rotation 58 Degrees  supine with shoulder abd 80 deg   PROM   PROM Assessment Site Shoulder   Right/Left Shoulder Right   Right Shoulder ABduction 65 Degrees  supine, abd to 80 deg   Strength   Right Shoulder Flexion --  5-/5   Right Shoulder ABduction 4+/5   Right Shoulder Internal Rotation 4+/5   Right Shoulder External Rotation 4+/5           [facility] Adult PT Treatment/Exercise - 11/24/15 0001    Shoulder Exercises: Supine   Horizontal ABduction Strengthening;Both;10 reps;Theraband   Theraband Level (Shoulder Horizontal ABduction) Level 1 (Yellow)   External Rotation AAROM;10 reps;Right  10 sec, with cane   Theraband Level (Shoulder External Rotation) Level 1 (Yellow)  bilat ER with band after AAROM, 2 sets   Flexion AAROM;Right  8 reps with 5 sec hold, with cane   Theraband Level (Shoulder Flexion) Level 1 (Yellow)  2 reps with yellow band   Other Supine Exercises Sash, to tolerance, with yellow band x 10 reps    Shoulder Exercises: Standing   Other Standing Exercises weight shifts into RUE  with hands on counter top x 10    Shoulder Exercises: Pulleys   Flexion --  10 sec hold x 10   ABduction --  10 sec hold x 10   Shoulder Exercises: ROM/Strengthening   Wall Pushups 10 reps   Electrical Stimulation   Electrical Stimulation Location Rt shoulder    Electrical Stimulation Action IFC   Electrical Stimulation Parameters to tolerance    Electrical Stimulation Goals Tone;Pain   Vasopneumatic   Number Minutes Vasopneumatic  15 minutes   Vasopnuematic Location  Shoulder   Vasopneumatic Pressure Medium   Vasopneumatic Temperature  3*                PT Education - 11/24/15 1119    Education provided Yes   Education Details HEP   Person(s) Educated Patient   Methods Handout;Explanation   Comprehension Returned demonstration;Verbalized  understanding             PT Long Term Goals - 11/10/15 1249    PT LONG TERM GOAL #1   Title improve posture and alignment with pt to demo improved posterior shoulder girdle control 12/15/15   Time 6   Period Weeks   Status On-going   PT LONG TERM GOAL #2   Title Increase Rt shoulder ROM to equal Lt shoulder ROM 12/15/15   Time 6   Period Weeks   Status On-going   PT LONG TERM GOAL #3   Title 5/5 strength Rt shoulder    Time 6   Period Weeks   Status On-going   PT LONG TERM GOAL #4   Title Patient reports ability to rest and sleep without awakening due to Rt shoulder pain 12/15/15   Time 6   Period Weeks   Status On-going   PT LONG TERM GOAL #5   Title I in HEP 12/15/15   Time 6   Period Weeks   Status On-going   PT LONG TERM GOAL #6   Title Improve FOTO to </= 36 % limitation 12/15/15   Time 6   Period Weeks   Status On-going               Plan - 11/24/15 1021    Clinical Impression Statement Pt tolerated all new exercises with mild increase in pain. Pt demonstrated improved Rt shoulder strength.  Making gradual progress towards goals.    Pt will benefit from skilled therapeutic intervention in order to improve on the following deficits Postural dysfunction;Improper body mechanics;Pain;Decreased range of motion;Decreased mobility;Decreased strength;Decreased endurance;Decreased activity tolerance;Increased fascial restricitons   Rehab Potential Good   PT Frequency 2x / week   PT Duration 6 weeks   PT Treatment/Interventions Patient/family education;ADLs/Self Care Home Management;Therapeutic exercise;Therapeutic activities;Manual techniques;Neuromuscular re-education;Dry needling;Cryotherapy;Electrical Stimulation;Iontophoresis 64m/ml Dexamethasone;Moist Heat;Ultrasound   PT Next Visit Plan Continue progressive stretching/strengthening to Rt shoulder.  Manual therapy to Rt shoulder/pec.     Consulted and Agree with Plan of Care Patient        Problem  List Patient Active Problem List   Diagnosis Date Noted  . Right shoulder pain 10/29/2014  . S/P appendectomy 01/02/2014  . GERD (gastroesophageal reflux disease) 11/25/2013  . OAB (overactive bladder) 11/25/2013  . Depression 02/04/2012  . Diabetes mellitus type II 02/04/2012  . Major depressive disorder, recurrent episode, moderate ([place] 09/15/2011  . Generalized anxiety disorder 09/15/2011  . BINGE EATING DISORDER 06/10/2008  . OBSTRUCTIVE SLEEP APNEA 06/10/2008  . MIGRAINE UNSP W/O INTRACT W/O STATUS MIGRAINOSUS 06/10/2008  . BENIGN POSITIONAL VERTIGO  06/10/2008  . ESSENTIAL HYPERTENSION, BENIGN 06/10/2008  . ALLERGIC RHINITIS 06/10/2008  . Diabetes type 2, uncontrolled ([place]) 05/22/2008  . DEPRESSION/ANXIETY 05/22/2008  . TRANSAMINASES, SERUM, ELEVATED 05/22/2008    Kerin Perna, PTA 11/24/2015 11:20 AM  [place] [facility] [place], [place], [address] Phone: [phone]   Fax:  [phone]  Name: Jennifer Conrad MRN: [id number] Date of Birth: [birth year]    PHYSICAL THERAPY DISCHARGE SUMMARY  Visits from Start of Care: 4  Current functional level related to goals / functional outcomes: Progressing well with decreased pain and improved function    Remaining deficits: Would benefit from continued PT to accomplish goals of therapy    Education / Equipment: HEP; theraband Plan: Patient agrees to discharge.  Patient goals were partially met. Patient is being discharged due to not returning since the last visit.  ?????    Celyn P. Helene Kelp PT, MPH 01/06/2016 10:06 AM

## 2015-11-26 ENCOUNTER — Telehealth: Payer: Self-pay | Admitting: Physician Assistant

## 2015-11-26 NOTE — Telephone Encounter (Signed)
I left patient a message to schedule a f/u with Lesly RubensteinJade for her BP meds

## 2015-12-06 ENCOUNTER — Other Ambulatory Visit: Payer: Self-pay | Admitting: Physician Assistant

## 2015-12-10 ENCOUNTER — Other Ambulatory Visit: Payer: Self-pay | Admitting: Physician Assistant

## 2015-12-11 ENCOUNTER — Telehealth: Payer: Self-pay | Admitting: Physician Assistant

## 2015-12-11 NOTE — Telephone Encounter (Signed)
I called pt and left a message for pt to call back and schedule f/u with Jade on BP

## 2016-06-15 ENCOUNTER — Emergency Department
Admission: EM | Admit: 2016-06-15 | Discharge: 2016-06-15 | Disposition: A | Discharge status: Home / Self Care | Payer: Commercial Managed Care - PPO | Source: Home / Self Care | Attending: Family Medicine | Admitting: Family Medicine

## 2016-06-15 ENCOUNTER — Emergency Department (INDEPENDENT_AMBULATORY_CARE_PROVIDER_SITE_OTHER): Payer: Commercial Managed Care - PPO

## 2016-06-15 ENCOUNTER — Encounter: Payer: Self-pay | Admitting: *Deleted

## 2016-06-15 DIAGNOSIS — R071 Chest pain on breathing: Secondary | ICD-10-CM

## 2016-06-15 DIAGNOSIS — R05 Cough: Secondary | ICD-10-CM | POA: Diagnosis not present

## 2016-06-15 DIAGNOSIS — R0781 Pleurodynia: Secondary | ICD-10-CM | POA: Diagnosis not present

## 2016-06-15 NOTE — ED Triage Notes (Signed)
Pt reports rolling over in bed yesterday feeling a pop on her chest under her right breast. Since, c/o pain coughing and deep breathing. Slightly SOB, 02 sat 92%

## 2016-06-15 NOTE — Discharge Instructions (Addendum)
Apply ice pack for 20 to 30 minutes, 3 to 4 times daily  Continue until pain decreases.  Wear rib belt as tolerated.  May continue Ibuprofen 200mg , 4 tabs every 8 hours with food.  If symptoms become significantly worse during the night or over the weekend, proceed to the local emergency room.

## 2016-06-15 NOTE — ED Provider Notes (Signed)
[name]    CSN: [id number] Arrival date & time: 06/15/16  1455     History   Chief Complaint Chief Complaint  Patient presents with  . Chest Pain    HPI Jennifer Conrad is a 55 y.o. female.   Yesterday while rolling over in bed, patient felt a painful popping sensation in her right chest underneath her breast.  Since then she has had pleuritic pain with cough and deep inspiration.  She complains of being slightly short of breath.  No fevers, chills, and sweats.   The history is provided by the patient.  Chest Pain  Pain location:  R chest Pain quality: stabbing   Pain quality: not radiating   Pain radiates to:  Does not radiate Pain severity:  Moderate Onset quality:  Sudden Duration:  1 day Timing:  Intermittent Progression:  Unchanged Chronicity:  New Context: breathing, movement and at rest   Context: not eating, not raising an arm and not trauma   Relieved by:  Nothing Worsened by:  Coughing, deep breathing and movement Ineffective treatments:  None tried Associated symptoms: shortness of breath   Associated symptoms: no abdominal pain, no back pain, no cough, no diaphoresis, no dizziness, no dysphagia, no fatigue, no fever, no heartburn, no lower extremity edema, no nausea, no palpitations and no syncope   Risk factors: diabetes mellitus, hypertension and obesity     Past Medical History:  Diagnosis Date  . Depression   . Diabetes mellitus   . Hypertension   . Obesity (BMI 30-39.9)    alowly losing weight/smaller portions/exercise 3 X week    Patient Active Problem List   Diagnosis Date Noted  . Right shoulder pain 10/29/2014  . S/P appendectomy 01/02/2014  . GERD (gastroesophageal reflux disease) 11/25/2013  . OAB (overactive bladder) 11/25/2013  . Depression 02/04/2012  . Diabetes mellitus type II 02/04/2012  . Major depressive disorder, recurrent episode, moderate (HCC) 09/15/2011  . Generalized anxiety disorder 09/15/2011  .  BINGE EATING DISORDER 06/10/2008  . OBSTRUCTIVE SLEEP APNEA 06/10/2008  . MIGRAINE UNSP W/O INTRACT W/O STATUS MIGRAINOSUS 06/10/2008  . BENIGN POSITIONAL VERTIGO 06/10/2008  . ESSENTIAL HYPERTENSION, BENIGN 06/10/2008  . ALLERGIC RHINITIS 06/10/2008  . Diabetes type 2, uncontrolled (HCC) 05/22/2008  . DEPRESSION/ANXIETY 05/22/2008  . TRANSAMINASES, SERUM, ELEVATED 05/22/2008    Past Surgical History:  Procedure Laterality Date  . ABDOMINAL HYSTERECTOMY    . APPENDECTOMY    . CARPAL TUNNEL RELEASE  10/2011  . CHOLECYSTECTOMY      OB History    No data available       Home Medications    Prior to Admission medications   Medication Sig Start Date End Date Taking? Authorizing Provider  AMBULATORY NON FORMULARY MEDICATION Medication Name: One touch ultra mini strips  To test blood sugar three times a day. 06/30/12   Jade L Breeback, PA-C  atenolol (TENORMIN) 25 MG tablet TAKE 1 TABLET BY MOUTH 2 TIMES DAILY. SCHEDULE APPOINTMENT FOR FURTHER REFILLS 08/29/15   Jade L Breeback, PA-C  buPROPion (WELLBUTRIN XL) 150 MG 24 hr tablet Take 2 tablets (300 mg total) by mouth daily. Patient needs to schedule a follow up appointment before more refills. 06/11/15   Jade L Breeback, PA-C  canagliflozin (INVOKANA) 100 MG TABS tablet Take 1 tablet by mouth every day **OFFICE APPOINTMENT NEEDED FOR REFILLS** Patient not taking: Reported on 11/03/2015 08/27/14   Jade L Breeback, PA-C  escitalopram (LEXAPRO) 20 MG tablet TAKE 1 TABLET BY MOUTH  DAILY 04/24/15   Jomarie Longs, PA-C  HYDROcodone-acetaminophen (NORCO/VICODIN) 5-325 MG per tablet Take one by mouth at bedtime as needed for pain Patient not taking: Reported on 11/03/2015 11/18/14   Lattie Haw, MD  metFORMIN (GLUCOPHAGE) 1000 MG tablet TAKE 1 TABLET (1,000 MG TOTAL) BY MOUTH 3 (THREE) TIMES DAILY WITH MEALS. Patient not taking: Reported on 11/03/2015 04/24/15   Jomarie Longs, PA-C  metFORMIN (GLUCOPHAGE) 1000 MG tablet Take 1 tablet (1,000  mg total) by mouth 3 (three) times daily with meals. Patient not taking: Reported on 11/03/2015 06/11/15   Jade L Breeback, PA-C  Olmesartan-Amlodipine-HCTZ 40-10-25 MG TABS TAKE 1 TABLET BY MOUTH DAILY. NEED FOLLOW UP APPOINTMENT FOR MORE REFILLS 11/07/15   Jade L Breeback, PA-C  oxybutynin (DITROPAN-XL) 10 MG 24 hr tablet TAKE 1 TABLET (10 MG TOTAL) BY MOUTH DAILY. 10/14/15   Jade L Breeback, PA-C  pantoprazole (PROTONIX) 40 MG tablet Take 1 tablet (40 mg total) by mouth daily. 10/26/14   Jade L Breeback, PA-C  traMADol (ULTRAM) 50 MG tablet Take 1 tablet (50 mg total) by mouth every 6 (six) hours as needed for pain. Patient not taking: Reported on 11/03/2015 02/19/13   Floydene Flock, MD  triamcinolone ointment (KENALOG) 0.5 % Apply 1 application topically 2 (two) times daily. 08/14/13   Jomarie Longs, PA-C    Family History Family History  Problem Relation Age of Onset  . Depression Sister   . Drug abuse Cousin   . Depression Sister   . Diabetes Mother   . Cancer Mother   . Cancer Father     Social History Social History  Substance Use Topics  . Smoking status: Never Smoker  . Smokeless tobacco: Never Used  . Alcohol use No     Allergies   Ace inhibitors   Review of Systems Review of Systems  Constitutional: Negative for diaphoresis, fatigue and fever.  HENT: Negative for trouble swallowing.   Respiratory: Positive for shortness of breath. Negative for cough.   Cardiovascular: Positive for chest pain. Negative for palpitations and syncope.  Gastrointestinal: Negative for abdominal pain, heartburn and nausea.  Musculoskeletal: Negative for back pain.  Neurological: Negative for dizziness.  All other systems reviewed and are negative.    Physical Exam Triage Vital Signs ED Triage Vitals  Enc Vitals Group     BP      Pulse      Resp      Temp      Temp src      SpO2      Weight      Height      Head Circumference      Peak Flow      Pain Score      Pain Loc       Pain Edu?      Excl. in GC?    No data found.   Updated Vital Signs BP 149/83 (BP Location: Left Arm)   Pulse 72   Temp 98.8 F (37.1 C) (Oral)   Resp 14   Wt 206 lb (93.4 kg)   SpO2 92%   BMI 37.68 kg/m   Visual Acuity Right Eye Distance:   Left Eye Distance:   Bilateral Distance:    Right Eye Near:   Left Eye Near:    Bilateral Near:     Physical Exam  Constitutional: She appears well-developed and well-nourished. No distress.  HENT:  Head: Atraumatic.  Right Ear: External ear normal.  Left Ear: External ear normal.  Nose: Nose normal.  Mouth/Throat: Oropharynx is clear and moist.  Eyes: Conjunctivae and EOM are normal. Pupils are equal, round, and reactive to light.  Neck: Neck supple. No thyromegaly present.  Cardiovascular: Normal heart sounds.   Pulmonary/Chest: Effort normal and breath sounds normal. No respiratory distress. She exhibits tenderness. She exhibits no crepitus and no swelling.    Abdominal: There is no tenderness.  Musculoskeletal: She exhibits no edema or tenderness.  Lymphadenopathy:    She has no cervical adenopathy.  Skin: Skin is warm and dry. No rash noted.  Nursing note and vitals reviewed.    UC Treatments / Results  Labs (all labs ordered are listed, but only abnormal results are displayed) Labs Reviewed - No data to display  EKG  EKG Interpretation None       Radiology Study Result   CLINICAL DATA:  Right-sided chest pain worse with coughing and deep breathing.  EXAM: RIGHT RIBS AND CHEST - 3+ VIEW  COMPARISON:  Chest and right rib films on 02/19/2013  FINDINGS: Lung volumes are low with bibasilar atelectasis present. There is no evidence of pulmonary edema, consolidation, pneumothorax, nodule or pleural fluid. The heart size and mediastinal contours are normal.  Right rib films show no evidence of fracture or bony lesion.  IMPRESSION: No evidence of right rib fracture or other acute  findings.   Electronically Signed   By: Irish LackGlenn  Yamagata M.D.   On: 06/15/2016 16:17     Procedures Procedures (including critical care time)  Medications Ordered in UC Medications - No data to display   Initial Impression / Assessment and Plan / UC Course  I have reviewed the triage vital signs and the nursing notes.  Pertinent labs & imaging results that were available during my care of the patient were reviewed by me and considered in my medical decision making (see chart for details).  Clinical Course  No evidence rib fracture.  ?cartilage injury or ?intercostal muscle strain. Dispensed rib belt. Apply ice pack for 20 to 30 minutes, 3 to 4 times daily  Continue until pain decreases.  Wear rib belt as tolerated.  May continue Ibuprofen 200mg , 4 tabs every 8 hours with food.  If symptoms become significantly worse during the night or over the weekend, proceed to the local emergency room.  Followup with Family Doctor if not improved in two weeks.    Final Clinical Impressions(s) / UC Diagnoses   Final diagnoses:  Rib pain on right side    New Prescriptions Discharge Medication List as of 06/15/2016  4:44 PM       Lattie HawStephen A Elzabeth Mcquerry, MD 06/29/16 [phone]

## 2016-09-21 ENCOUNTER — Emergency Department (INDEPENDENT_AMBULATORY_CARE_PROVIDER_SITE_OTHER): Payer: Commercial Managed Care - PPO

## 2016-09-21 ENCOUNTER — Emergency Department
Admission: EM | Admit: 2016-09-21 | Discharge: 2016-09-21 | Disposition: A | Discharge status: Home / Self Care | Payer: Commercial Managed Care - PPO | Source: Home / Self Care | Attending: Family Medicine | Admitting: Family Medicine

## 2016-09-21 DIAGNOSIS — R05 Cough: Secondary | ICD-10-CM | POA: Diagnosis not present

## 2016-09-21 DIAGNOSIS — J40 Bronchitis, not specified as acute or chronic: Secondary | ICD-10-CM

## 2016-09-21 DIAGNOSIS — R6883 Chills (without fever): Secondary | ICD-10-CM | POA: Diagnosis not present

## 2016-09-21 MED ORDER — BENZONATATE 200 MG PO CAPS: ORAL_CAPSULE | ORAL | 0 refills | Status: DC

## 2016-09-21 MED ORDER — DOXYCYCLINE HYCLATE 100 MG PO CAPS: 100.0000 mg | ORAL_CAPSULE | Freq: Two times a day (BID) | ORAL | 0 refills | Status: DC

## 2016-09-21 NOTE — ED Provider Notes (Signed)
[name]    CSN: [id number] Arrival date & time: 09/21/16  1324     History   Chief Complaint Chief Complaint  Patient presents with  . Cough  . Sore Throat    HPI Jennifer Conrad is a 56 y.o. female.   Complains of 4 day history flu-like illness including myalgias, headache, fever/chills, fatigue, and cough.  Also has mild nasal congestion and sore throat.  Cough is non-productive and worse at night.  She complains of shortness of breath at times and anterior chest discomfort.  Her symptoms have developed gradually.  She has a past history of pneumonia about 5 years ago, and she feels like she did then.    The history is provided by the patient.    Past Medical History:  Diagnosis Date  . Depression   . Diabetes mellitus   . Hypertension   . Obesity (BMI 30-39.9)    alowly losing weight/smaller portions/exercise 3 X week    Patient Active Problem List   Diagnosis Date Noted  . Right shoulder pain 10/29/2014  . S/P appendectomy 01/02/2014  . GERD (gastroesophageal reflux disease) 11/25/2013  . OAB (overactive bladder) 11/25/2013  . Depression 02/04/2012  . Diabetes mellitus type II 02/04/2012  . Major depressive disorder, recurrent episode, moderate (HCC) 09/15/2011  . Generalized anxiety disorder 09/15/2011  . BINGE EATING DISORDER 06/10/2008  . OBSTRUCTIVE SLEEP APNEA 06/10/2008  . MIGRAINE UNSP W/O INTRACT W/O STATUS MIGRAINOSUS 06/10/2008  . BENIGN POSITIONAL VERTIGO 06/10/2008  . ESSENTIAL HYPERTENSION, BENIGN 06/10/2008  . ALLERGIC RHINITIS 06/10/2008  . Diabetes type 2, uncontrolled (HCC) 05/22/2008  . DEPRESSION/ANXIETY 05/22/2008  . TRANSAMINASES, SERUM, ELEVATED 05/22/2008    Past Surgical History:  Procedure Laterality Date  . ABDOMINAL HYSTERECTOMY    . APPENDECTOMY    . CARPAL TUNNEL RELEASE  10/2011  . CHOLECYSTECTOMY      OB History    No data available       Home Medications    Prior to Admission medications     Medication Sig Start Date End Date Taking? Authorizing Provider  AMBULATORY NON FORMULARY MEDICATION Medication Name: One touch ultra mini strips  To test blood sugar three times a day. 06/30/12   Jade L Breeback, PA-C  atenolol (TENORMIN) 25 MG tablet TAKE 1 TABLET BY MOUTH 2 TIMES DAILY. SCHEDULE APPOINTMENT FOR FURTHER REFILLS 08/29/15   Jade L Breeback, PA-C  benzonatate (TESSALON) 200 MG capsule Take one cap by mouth at bedtime as needed for cough.  May repeat in 4 to 6 hours 09/21/16   Lattie Haw, MD  buPROPion (WELLBUTRIN XL) 150 MG 24 hr tablet Take 2 tablets (300 mg total) by mouth daily. Patient needs to schedule a follow up appointment before more refills. 06/11/15   Jade L Breeback, PA-C  canagliflozin (INVOKANA) 100 MG TABS tablet Take 1 tablet by mouth every day **OFFICE APPOINTMENT NEEDED FOR REFILLS** Patient not taking: Reported on 11/03/2015 08/27/14   Jomarie Longs, PA-C  doxycycline (VIBRAMYCIN) 100 MG capsule Take 1 capsule (100 mg total) by mouth 2 (two) times daily. Take with food. 09/21/16   Lattie Haw, MD  escitalopram (LEXAPRO) 20 MG tablet TAKE 1 TABLET BY MOUTH DAILY 04/24/15   Jomarie Longs, PA-C  HYDROcodone-acetaminophen (NORCO/VICODIN) 5-325 MG per tablet Take one by mouth at bedtime as needed for pain Patient not taking: Reported on 11/03/2015 11/18/14   Lattie Haw, MD  metFORMIN (GLUCOPHAGE) 1000 MG tablet TAKE 1 TABLET (1,000  MG TOTAL) BY MOUTH 3 (THREE) TIMES DAILY WITH MEALS. Patient not taking: Reported on 11/03/2015 04/24/15   Jomarie Longs, PA-C  metFORMIN (GLUCOPHAGE) 1000 MG tablet Take 1 tablet (1,000 mg total) by mouth 3 (three) times daily with meals. Patient not taking: Reported on 11/03/2015 06/11/15   Jade L Breeback, PA-C  Olmesartan-Amlodipine-HCTZ 40-10-25 MG TABS TAKE 1 TABLET BY MOUTH DAILY. NEED FOLLOW UP APPOINTMENT FOR MORE REFILLS 11/07/15   Jade L Breeback, PA-C  oxybutynin (DITROPAN-XL) 10 MG 24 hr tablet TAKE 1 TABLET (10 MG  TOTAL) BY MOUTH DAILY. 10/14/15   Jade L Breeback, PA-C  pantoprazole (PROTONIX) 40 MG tablet Take 1 tablet (40 mg total) by mouth daily. 10/26/14   Jade L Breeback, PA-C  traMADol (ULTRAM) 50 MG tablet Take 1 tablet (50 mg total) by mouth every 6 (six) hours as needed for pain. Patient not taking: Reported on 11/03/2015 02/19/13   Floydene Flock, MD  triamcinolone ointment (KENALOG) 0.5 % Apply 1 application topically 2 (two) times daily. 08/14/13   Jomarie Longs, PA-C    Family History Family History  Problem Relation Age of Onset  . Depression Sister   . Drug abuse Cousin   . Depression Sister   . Diabetes Mother   . Cancer Mother   . Cancer Father     Social History Social History  Substance Use Topics  . Smoking status: Never Smoker  . Smokeless tobacco: Never Used  . Alcohol use No     Allergies   Ace inhibitors and Codeine   Review of Systems Review of Systems + sore throat + cough No pleuritic pain No wheezing + nasal congestion + post-nasal drainage No sinus pain/pressure No itchy/red eyes No earache No hemoptysis + SOB No fever, + chills/sweats No nausea No vomiting No abdominal pain No diarrhea No urinary symptoms No skin rash + fatigue + myalgias + headache Used OTC meds without relief   Physical Exam Triage Vital Signs ED Triage Vitals [09/21/16 0841]  Enc Vitals Group     BP 179/84     Pulse Rate 90     Resp      Temp 98.6 F (37 C)     Temp Source Oral     SpO2 92 %     Weight 198 lb (89.8 kg)     Height 5\' 2"  (1.575 m)     Head Circumference      Peak Flow      Pain Score      Pain Loc      Pain Edu?      Excl. in GC?    No data found.   Updated Vital Signs BP 179/84 (BP Location: Left Arm)   Pulse 90   Temp 98.6 F (37 C) (Oral)   Ht 5\' 2"  (1.575 m)   Wt 198 lb (89.8 kg)   SpO2 92%   BMI 36.21 kg/m   Visual Acuity Right Eye Distance:   Left Eye Distance:   Bilateral Distance:    Right Eye Near:   Left Eye  Near:    Bilateral Near:     Physical Exam Nursing notes and Vital Signs reviewed. Appearance:  Patient appears stated age, and in no acute distress Eyes:  Pupils are equal, round, and reactive to light and accomodation.  Extraocular movement is intact.  Conjunctivae are not inflamed  Ears:  Canals normal.  Tympanic membranes normal.  Nose:  Mildly congested turbinates with clear mucous.  No sinus tenderness.  Pharynx:  Uvula is erythematous Neck:  Supple.  Tender enlarged posterior/lateral nodes are palpated bilaterally  Lungs:   Course breath sounds bilaterally.  Breath sounds are equal.  Moving air well. Heart:  Regular rate and rhythm without murmurs, rubs, or gallops.  Abdomen:  Nontender without masses or hepatosplenomegaly.  Bowel sounds are present.  No CVA or flank tenderness.  Extremities:  No edema.  Skin:  No rash present.    UC Treatments / Results  Labs (all labs ordered are listed, but only abnormal results are displayed) Labs Reviewed - No data to display  EKG  EKG Interpretation None       Radiology Dg Chest 2 View  Result Date: 09/21/2016 CLINICAL DATA:  History of cough, chills EXAM: CHEST  2 VIEW COMPARISON:  02/19/2013 FINDINGS: Cardiomediastinal silhouette is unremarkable. No infiltrate or pleural effusion. No pulmonary edema. Mild perihilar bronchitic changes. Degenerative changes thoracic spine. IMPRESSION: No infiltrate or pulmonary edema. Mild perihilar bronchitic changes. Electronically Signed   By: Natasha MeadLiviu  Pop M.D.   On: 09/21/2016 09:33    Procedures Procedures (including critical care time)  Medications Ordered in UC Medications - No data to display   Initial Impression / Assessment and Plan / UC Course  I have reviewed the triage vital signs and the nursing notes.  Pertinent labs & imaging results that were available during my care of the patient were reviewed by me and considered in my medical decision making (see chart for  details).  Clinical Course   Bronchitis probably viral, but will begin empiric doxycycline for atypical coverage. Prescription written for Benzonatate [facility](Tessalon) to take at bedtime for night-time cough.  Take plain guaifenesin (1200mg  extended release tabs such as Mucinex) twice daily, with plenty of water, for cough and congestion.  Get adequate rest.   May use Afrin nasal spray (or generic oxymetazoline) twice daily for about 5 days and then discontinue.  Also recommend using saline nasal spray several times daily and saline nasal irrigation (AYR is a common brand).  Use Flonase nasal spray each morning after using Afrin nasal spray and saline nasal irrigation. Try warm salt water gargles for sore throat.  Stop all antihistamines for now, and other non-prescription cough/cold preparations. May take Tylenol, orIbuprofen 200mg , 4 tabs every 8 hours with food for fever, body aches, etc. Followup with Family Doctor if not improved in one week.      Final Clinical Impressions(s) / UC Diagnoses   Final diagnoses:  Bronchitis    New Prescriptions New Prescriptions   BENZONATATE (TESSALON) 200 MG CAPSULE    Take one cap by mouth at bedtime as needed for cough.  May repeat in 4 to 6 hours   DOXYCYCLINE (VIBRAMYCIN) 100 MG CAPSULE    Take 1 capsule (100 mg total) by mouth 2 (two) times daily. Take with food.     Lattie HawStephen A Jaidin Richison, MD 09/21/16 [phone]

## 2016-09-21 NOTE — ED Triage Notes (Signed)
Friday, congested.  Monday, generalized aching, sore throat, coughing, and sneezing.

## 2016-09-21 NOTE — Discharge Instructions (Signed)
Take plain guaifenesin (1200mg  extended release tabs such as Mucinex) twice daily, with plenty of water, for cough and congestion.  Get adequate rest.   May use Afrin nasal spray (or generic oxymetazoline) twice daily for about 5 days and then discontinue.  Also recommend using saline nasal spray several times daily and saline nasal irrigation (AYR is a common brand).  Use Flonase nasal spray each morning after using Afrin nasal spray and saline nasal irrigation. Try warm salt water gargles for sore throat.  Stop all antihistamines for now, and other non-prescription cough/cold preparations. May take Tylenol, orIbuprofen 200mg , 4 tabs every 8 hours with food for fever, body aches, etc.

## 2016-12-20 ENCOUNTER — Emergency Department
Admission: EM | Admit: 2016-12-20 | Discharge: 2016-12-20 | Disposition: A | Discharge status: Home / Self Care | Payer: Commercial Managed Care - PPO | Source: Home / Self Care | Attending: Family Medicine | Admitting: Family Medicine

## 2016-12-20 ENCOUNTER — Emergency Department (INDEPENDENT_AMBULATORY_CARE_PROVIDER_SITE_OTHER): Payer: Commercial Managed Care - PPO

## 2016-12-20 DIAGNOSIS — J101 Influenza due to other identified influenza virus with other respiratory manifestations: Secondary | ICD-10-CM | POA: Diagnosis not present

## 2016-12-20 DIAGNOSIS — R509 Fever, unspecified: Secondary | ICD-10-CM

## 2016-12-20 DIAGNOSIS — R0902 Hypoxemia: Secondary | ICD-10-CM

## 2016-12-20 DIAGNOSIS — R05 Cough: Secondary | ICD-10-CM

## 2016-12-20 LAB — POCT INFLUENZA A/B
INFLUENZA B, POC: POSITIVE — AB
Influenza A, POC: NEGATIVE

## 2016-12-20 MED ORDER — ALBUTEROL SULFATE HFA 108 (90 BASE) MCG/ACT IN AERS: 2.0000 | INHALATION_SPRAY | RESPIRATORY_TRACT | 0 refills | Status: AC | PRN

## 2016-12-20 MED ORDER — BENZONATATE 200 MG PO CAPS: ORAL_CAPSULE | ORAL | 0 refills | Status: DC

## 2016-12-20 MED ORDER — IPRATROPIUM-ALBUTEROL 0.5-2.5 (3) MG/3ML IN SOLN
3.0000 mL | Freq: Once | RESPIRATORY_TRACT | Status: AC
  Administered 2016-12-20: 3 mL via RESPIRATORY_TRACT

## 2016-12-20 MED ORDER — OSELTAMIVIR PHOSPHATE 75 MG PO CAPS: 75.0000 mg | ORAL_CAPSULE | Freq: Two times a day (BID) | ORAL | 0 refills | Status: DC

## 2016-12-20 NOTE — ED Provider Notes (Signed)
[name]    CSN: [id number] Arrival date & time: 12/20/16  1010     History   Chief Complaint Chief Complaint  Patient presents with  . Cough  . Nasal Congestion    HPI Jennifer Conrad is a 56 y.o. female.   Complains of 2.5 day history flu-like illness including myalgias, headache, fever (101.7)/chills, fatigue, and cough.  Also has mild nasal congestion and sore throat.  Cough is non-productive and somewhat worse at night.  No pleuritic pain, but feels tightness in her anterior chest and shortness of breath and wheezing.   The history is provided by the patient.    Past Medical History:  Diagnosis Date  . Depression   . Diabetes mellitus   . Hypertension   . Obesity (BMI 30-39.9)    alowly losing weight/smaller portions/exercise 3 X week    Patient Active Problem List   Diagnosis Date Noted  . Right shoulder pain 10/29/2014  . S/P appendectomy 01/02/2014  . GERD (gastroesophageal reflux disease) 11/25/2013  . OAB (overactive bladder) 11/25/2013  . Depression 02/04/2012  . Diabetes mellitus type II 02/04/2012  . Major depressive disorder, recurrent episode, moderate (HCC) 09/15/2011  . Generalized anxiety disorder 09/15/2011  . BINGE EATING DISORDER 06/10/2008  . OBSTRUCTIVE SLEEP APNEA 06/10/2008  . MIGRAINE UNSP W/O INTRACT W/O STATUS MIGRAINOSUS 06/10/2008  . BENIGN POSITIONAL VERTIGO 06/10/2008  . ESSENTIAL HYPERTENSION, BENIGN 06/10/2008  . ALLERGIC RHINITIS 06/10/2008  . Diabetes type 2, uncontrolled (HCC) 05/22/2008  . DEPRESSION/ANXIETY 05/22/2008  . TRANSAMINASES, SERUM, ELEVATED 05/22/2008    Past Surgical History:  Procedure Laterality Date  . ABDOMINAL HYSTERECTOMY    . APPENDECTOMY    . CARPAL TUNNEL RELEASE  10/2011  . CHOLECYSTECTOMY      OB History    No data available       Home Medications    Prior to Admission medications   Medication Sig Start Date End Date Taking? Authorizing Provider  albuterol  (PROVENTIL HFA;VENTOLIN HFA) 108 (90 Base) MCG/ACT inhaler Inhale 2 puffs into the lungs every 4 (four) hours as needed for wheezing or shortness of breath. 12/20/16   Lattie Haw, MD  AMBULATORY NON FORMULARY MEDICATION Medication Name: One touch ultra mini strips  To test blood sugar three times a day. 06/30/12   Jade L Breeback, PA-C  atenolol (TENORMIN) 25 MG tablet TAKE 1 TABLET BY MOUTH 2 TIMES DAILY. SCHEDULE APPOINTMENT FOR FURTHER REFILLS 08/29/15   Jade L Breeback, PA-C  benzonatate (TESSALON) 200 MG capsule Take one cap by mouth at bedtime as needed for cough.  May repeat in 4 to 6 hours 12/20/16   Lattie Haw, MD  buPROPion (WELLBUTRIN XL) 150 MG 24 hr tablet Take 2 tablets (300 mg total) by mouth daily. Patient needs to schedule a follow up appointment before more refills. 06/11/15   Jade L Breeback, PA-C  canagliflozin (INVOKANA) 100 MG TABS tablet Take 1 tablet by mouth every day **OFFICE APPOINTMENT NEEDED FOR REFILLS** Patient not taking: Reported on 11/03/2015 08/27/14   Jomarie Longs, PA-C  doxycycline (VIBRAMYCIN) 100 MG capsule Take 1 capsule (100 mg total) by mouth 2 (two) times daily. Take with food. 09/21/16   Lattie Haw, MD  escitalopram (LEXAPRO) 20 MG tablet TAKE 1 TABLET BY MOUTH DAILY 04/24/15   Jomarie Longs, PA-C  HYDROcodone-acetaminophen (NORCO/VICODIN) 5-325 MG per tablet Take one by mouth at bedtime as needed for pain Patient not taking: Reported on 11/03/2015 11/18/14  Lattie Haw, MD  metFORMIN (GLUCOPHAGE) 1000 MG tablet TAKE 1 TABLET (1,000 MG TOTAL) BY MOUTH 3 (THREE) TIMES DAILY WITH MEALS. Patient not taking: Reported on 11/03/2015 04/24/15   Jomarie Longs, PA-C  metFORMIN (GLUCOPHAGE) 1000 MG tablet Take 1 tablet (1,000 mg total) by mouth 3 (three) times daily with meals. Patient not taking: Reported on 11/03/2015 06/11/15   Jade L Breeback, PA-C  Olmesartan-Amlodipine-HCTZ 40-10-25 MG TABS TAKE 1 TABLET BY MOUTH DAILY. NEED FOLLOW UP APPOINTMENT  FOR MORE REFILLS 11/07/15   Jomarie Longs, PA-C  oseltamivir (TAMIFLU) 75 MG capsule Take 1 capsule (75 mg total) by mouth every 12 (twelve) hours. 12/20/16   Lattie Haw, MD  oxybutynin (DITROPAN-XL) 10 MG 24 hr tablet TAKE 1 TABLET (10 MG TOTAL) BY MOUTH DAILY. 10/14/15   Jade L Breeback, PA-C  pantoprazole (PROTONIX) 40 MG tablet Take 1 tablet (40 mg total) by mouth daily. 10/26/14   Jade L Breeback, PA-C  traMADol (ULTRAM) 50 MG tablet Take 1 tablet (50 mg total) by mouth every 6 (six) hours as needed for pain. Patient not taking: Reported on 11/03/2015 02/19/13   Floydene Flock, MD  triamcinolone ointment (KENALOG) 0.5 % Apply 1 application topically 2 (two) times daily. 08/14/13   Jomarie Longs, PA-C    Family History Family History  Problem Relation Age of Onset  . Depression Sister   . Drug abuse Cousin   . Depression Sister   . Diabetes Mother   . Cancer Mother   . Cancer Father     Social History Social History  Substance Use Topics  . Smoking status: Never Smoker  . Smokeless tobacco: Never Used  . Alcohol use No     Allergies   Ace inhibitors and Codeine   Review of Systems Review of Systems ? sore throat  + cough No pleuritic pain, but feels tightness in her anterior chest. + wheezing + nasal congestion + post-nasal drainage No sinus pain/pressure No itchy/red eyes No earache No hemoptysis + SOB + fever, + chills No nausea No vomiting No abdominal pain No diarrhea No urinary symptoms No skin rash + fatigue + myalgias + headache Used OTC meds without relief   Physical Exam Triage Vital Signs ED Triage Vitals  Enc Vitals Group     BP 12/20/16 1121 (!) 162/87     Pulse Rate 12/20/16 1121 93     Resp --      Temp --      Temp Source 12/20/16 1121 Oral     SpO2 12/20/16 1121 (!) 77 %     Weight 12/20/16 1123 198 lb (89.8 kg)     Height 12/20/16 1123  (1.575 m)     Head Circumference --      Peak Flow --      Pain Score 12/20/16 1124  0     Pain Loc --      Pain Edu? --      Excl. in GC? --    No data found.   Updated Vital Signs BP (!) 162/87 (BP Location: Left Arm)   Pulse 93   Ht  (1.575 m)   Wt 198 lb (89.8 kg)   SpO2 92%   BMI 36.21 kg/m   Visual Acuity Right Eye Distance:   Left Eye Distance:   Bilateral Distance:    Right Eye Near:   Left Eye Near:    Bilateral Near:     Physical Exam  Nursing notes and Vital Signs reviewed. Appearance:  Patient appears stated age, and in no acute distress Eyes:  Pupils are equal, round, and reactive to light and accomodation.  Extraocular movement is intact.  Conjunctivae are not inflamed  Ears:  Canals normal.  Tympanic membranes normal.  Nose:  Mildly congested turbinates.  No sinus tenderness.    Pharynx:  Normal Neck:  Supple.  Tender enlarged posterior/lateral nodes are palpated bilaterally  Lungs:   Course breath sounds.  Breath sounds are equal.  Moving air well. Heart:  Regular rate and rhythm without murmurs, rubs, or gallops.  Abdomen:  Nontender without masses or hepatosplenomegaly.  Bowel sounds are present.  No CVA or flank tenderness.  Extremities:  No edema.  Skin:  No rash present.    UC Treatments / Results  Labs (all labs ordered are listed, but only abnormal results are displayed) Labs Reviewed  POCT INFLUENZA A/B - Abnormal; Notable for the following:       Result Value   Influenza B, POC Positive (*)    All other components within normal limits    EKG  EKG Interpretation None       Radiology Dg Chest 2 View  Result Date: 12/20/2016 CLINICAL DATA:  Cough and fever.  Hypoxia. EXAM: CHEST  2 VIEW COMPARISON:  09/21/2016 FINDINGS: The heart size and mediastinal contours are within normal limits. Both lungs are clear. The visualized skeletal structures are unremarkable. IMPRESSION: Normal exam. Electronically Signed   By: Francene Boyers M.D.   On: 12/20/2016 12:40    Procedures Procedures (including critical care  time)  Medications Ordered in UC Medications  ipratropium-albuterol (DUONEB) 0.5-2.5 (3) MG/3ML nebulizer solution 3 mL (3 mLs Nebulization Given 12/20/16 1210)     Initial Impression / Assessment and Plan / UC Course  I have reviewed the triage vital signs and the nursing notes.  Pertinent labs & imaging results that were available during my care of the patient were reviewed by me and considered in my medical decision making (see chart for details).    Administered DuoNeb by hand held nebulizer  Begin Tamiflu. Begin albuterol inhaler. Prescription written for Benzonatate [facility]) to take at bedtime for night-time cough.  Take plain guaifenesin (  extended release tabs such as Mucinex) twice daily, with plenty of water, for cough and congestion.  Get adequate rest.   May use Afrin nasal spray (or generic oxymetazoline) each morning for about 5 days and then discontinue.  Also recommend using saline nasal spray several times daily and saline nasal irrigation (AYR is a common brand).   Try warm salt water gargles for sore throat.  Stop all antihistamines for now, and other non-prescription cough/cold preparations. May take Ibuprofen , 4 tabs every 8 hours with food for body aches, headache, etc. If symptoms become significantly worse during the night or over the weekend, proceed to the local emergency room.  Followup with Family Doctor if not improved in one week.     Final Clinical Impressions(s) / UC Diagnoses   Final diagnoses:  Influenza B    New Prescriptions New Prescriptions   ALBUTEROL (PROVENTIL HFA;VENTOLIN HFA) 108 (90 BASE) MCG/ACT INHALER    Inhale 2 puffs into the lungs every 4 (four) hours as needed for wheezing or shortness of breath.   OSELTAMIVIR (TAMIFLU) 75 MG CAPSULE    Take 1 capsule (75 mg total) by mouth every 12 (twelve) hours.     Lattie Haw, MD 01/05/17 [phone]

## 2016-12-20 NOTE — Discharge Instructions (Signed)
Take plain guaifenesin (  extended release tabs such as Mucinex) twice daily, with plenty of water, for cough and congestion.  Get adequate rest.   May use Afrin nasal spray (or generic oxymetazoline) each morning for about 5 days and then discontinue.  Also recommend using saline nasal spray several times daily and saline nasal irrigation (AYR is a common brand).   Try warm salt water gargles for sore throat.  Stop all antihistamines for now, and other non-prescription cough/cold preparations. May take Ibuprofen , 4 tabs every 8 hours with food for body aches, headache, etc. If symptoms become significantly worse during the night or over the weekend, proceed to the local emergency room.

## 2016-12-20 NOTE — ED Triage Notes (Signed)
Pt stated that she got up Saturday morning feeling bad, like it was hard to get a deep breath.  She has had body aches and pains, and stuffy nose and congestion.

## 2017-12-08 ENCOUNTER — Emergency Department (INDEPENDENT_AMBULATORY_CARE_PROVIDER_SITE_OTHER)
Admission: EM | Admit: 2017-12-08 | Discharge: 2017-12-08 | Disposition: A | Discharge status: Home / Self Care | Payer: Self-pay | Source: Home / Self Care

## 2017-12-08 ENCOUNTER — Other Ambulatory Visit: Payer: Self-pay

## 2017-12-08 DIAGNOSIS — I10 Essential (primary) hypertension: Secondary | ICD-10-CM

## 2017-12-08 DIAGNOSIS — J019 Acute sinusitis, unspecified: Secondary | ICD-10-CM

## 2017-12-08 MED ORDER — FLUTICASONE PROPIONATE 50 MCG/ACT NA SUSP: NASAL | 0 refills | Status: DC

## 2017-12-08 MED ORDER — AMOXICILLIN-POT CLAVULANATE 875-125 MG PO TABS: 1.0000 | ORAL_TABLET | Freq: Two times a day (BID) | ORAL | 0 refills | Status: DC

## 2017-12-08 NOTE — Discharge Instructions (Signed)
Drink plenty of fluids and get enough rest  Use the fluticasone nose spray 2 sprays each nostril twice daily for 3 days, then reduce to once daily  Take the Augmentin 875 mg (amoxicillin/clavulanate) 1 twice daily for infection  Take an antihistamine decongestant such as Claritin-D or Allegra-D to try and open up your head more.  Stay off from work through tomorrow.  Return as needed

## 2017-12-08 NOTE — ED Triage Notes (Signed)
Pt c/o nasal congestion since Sunday. Has been sneezing and has nasal drainage. Ear pain when blowing nose.

## 2017-12-08 NOTE — ED Provider Notes (Signed)
[name]    CSN: [id number] Arrival date & time: 12/08/17  1056     History   Chief Complaint Chief Complaint  Patient presents with  . Nasal Congestion    HPI Jennifer Conrad is a 57 y.o. female.   HPI Patient started getting sick on Sunday.  It started with sneezing and nasal congestion.  Is gotten worse with more headache, congestion, and some coughing.  She is blowing more more purulent stuff out of her nose and getting more facial pain.  She does not have a history of springtime allergies.  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  She does not smoke.  She says that she had a temperature of 100 last night and developed right ear pain last night which is better this morning.  She has a history of hypertension which is treated, and she says it usually goes up when she gets ill.  She was instructed to monitor this. Past Medical History:  Diagnosis Date  . Depression   . Diabetes mellitus   . Hypertension   . Obesity (BMI 30-39.9)    alowly losing weight/smaller portions/exercise 3 X week    Patient Active Problem List   Diagnosis Date Noted  . Right shoulder pain 10/29/2014  . S/P appendectomy 01/02/2014  . GERD (gastroesophageal reflux disease) 11/25/2013  . OAB (overactive bladder) 11/25/2013  . Depression 02/04/2012  . Diabetes mellitus type II 02/04/2012  . Major depressive disorder, recurrent episode, moderate (HCC) 09/15/2011  . Generalized anxiety disorder 09/15/2011  . BINGE EATING DISORDER 06/10/2008  . OBSTRUCTIVE SLEEP APNEA 06/10/2008  . MIGRAINE UNSP W/O INTRACT W/O STATUS MIGRAINOSUS 06/10/2008  . BENIGN POSITIONAL VERTIGO 06/10/2008  . ESSENTIAL HYPERTENSION, BENIGN 06/10/2008  . ALLERGIC RHINITIS 06/10/2008  . Diabetes type 2, uncontrolled (HCC) 05/22/2008  . DEPRESSION/ANXIETY 05/22/2008  . TRANSAMINASES, SERUM, ELEVATED 05/22/2008    Past Surgical History:  Procedure Laterality Date  . ABDOMINAL HYSTERECTOMY    .  APPENDECTOMY    . CARPAL TUNNEL RELEASE  10/2011  . CHOLECYSTECTOMY      OB History   None      Home Medications    Prior to Admission medications   Medication Sig Start Date End Date Taking? Authorizing Provider  albuterol (PROVENTIL HFA;VENTOLIN HFA) 108 (90 Base) MCG/ACT inhaler Inhale 2 puffs into the lungs every 4 (four) hours as needed for wheezing or shortness of breath. 12/20/16   Lattie Haw, MD  AMBULATORY NON FORMULARY MEDICATION Medication Name: One touch ultra mini strips  To test blood sugar three times a day. 06/30/12   Breeback, Jade L, PA-C  atenolol (TENORMIN) 25 MG tablet TAKE 1 TABLET BY MOUTH 2 TIMES DAILY. SCHEDULE APPOINTMENT FOR FURTHER REFILLS 08/29/15   Tandy Gaw L, PA-C  benzonatate (TESSALON) 200 MG capsule Take one cap by mouth at bedtime as needed for cough.  May repeat in 4 to 6 hours 12/20/16   Lattie Haw, MD  buPROPion (WELLBUTRIN XL) 150 MG 24 hr tablet Take 2 tablets (300 mg total) by mouth daily. Patient needs to schedule a follow up appointment before more refills. 06/11/15   Breeback, Lonna Cobb, PA-C  canagliflozin (INVOKANA) 100 MG TABS tablet Take 1 tablet by mouth every day **OFFICE APPOINTMENT NEEDED FOR REFILLS** Patient not taking: Reported on 11/03/2015 08/27/14   Jomarie Longs, PA-C  doxycycline (VIBRAMYCIN) 100 MG capsule Take 1 capsule (100 mg total) by mouth 2 (two) times daily. Take with food. 09/21/16  Lattie Haw, MD  escitalopram (LEXAPRO) 20 MG tablet TAKE 1 TABLET BY MOUTH DAILY 04/24/15   Tandy Gaw L, PA-C  HYDROcodone-acetaminophen (NORCO/VICODIN) 5-325 MG per tablet Take one by mouth at bedtime as needed for pain Patient not taking: Reported on 11/03/2015 11/18/14   Lattie Haw, MD  metFORMIN (GLUCOPHAGE) 1000 MG tablet TAKE 1 TABLET (1,000 MG TOTAL) BY MOUTH 3 (THREE) TIMES DAILY WITH MEALS. Patient not taking: Reported on 11/03/2015 04/24/15   Jomarie Longs, PA-C  metFORMIN (GLUCOPHAGE) 1000 MG tablet  Take 1 tablet (1,000 mg total) by mouth 3 (three) times daily with meals. Patient not taking: Reported on 11/03/2015 06/11/15   Breeback, Jade L, PA-C  Olmesartan-Amlodipine-HCTZ 40-10-25 MG TABS TAKE 1 TABLET BY MOUTH DAILY. NEED FOLLOW UP APPOINTMENT FOR MORE REFILLS 11/07/15   Jomarie Longs, PA-C  oseltamivir (TAMIFLU) 75 MG capsule Take 1 capsule (75 mg total) by mouth every 12 (twelve) hours. 12/20/16   Lattie Haw, MD  oxybutynin (DITROPAN-XL) 10 MG 24 hr tablet TAKE 1 TABLET (10 MG TOTAL) BY MOUTH DAILY. 10/14/15   Breeback, Jade L, PA-C  pantoprazole (PROTONIX) 40 MG tablet Take 1 tablet (40 mg total) by mouth daily. 10/26/14   Breeback, Lonna Cobb, PA-C  traMADol (ULTRAM) 50 MG tablet Take 1 tablet (50 mg total) by mouth every 6 (six) hours as needed for pain. Patient not taking: Reported on 11/03/2015 02/19/13   Floydene Flock, MD  triamcinolone ointment (KENALOG) 0.5 % Apply 1 application topically 2 (two) times daily. 08/14/13   Breeback, Jade L, PA-C  oxybutynin (DITROPAN-XL) 10 MG 24 hr tablet Take 10 mg by mouth daily.  06/11/11 10/31/12  [provider]    Family History Family History  Problem Relation Age of Onset  . Depression Sister   . Drug abuse Cousin   . Depression Sister   . Diabetes Mother   . Cancer Mother   . Cancer Father     Social History Social History   Tobacco Use  . Smoking status: Never Smoker  . Smokeless tobacco: Never Used  Substance Use Topics  . Alcohol use: No  . Drug use: No     Allergies   Ace inhibitors and Codeine   Review of Systems Review of Systems Constitutional: Generalized malaise HEENT: Right ear pain last night.  Mild sore throat.  Sinus pressure and pain and drainage which is gray green. Respiratory: Cough, mildly productive.  No wheezing. Cardiovascular: Unremarkable GI: No nausea or vomiting or diarrhea or constipation. GU: Unremarkable  Physical Exam Triage Vital Signs ED Triage Vitals  Enc Vitals Group      BP 12/08/17 1112 (!) 171/83     Pulse Rate 12/08/17 1112 78     Resp 12/08/17 1112 18     Temp 12/08/17 1112 98.8 F (37.1 C)     Temp Source 12/08/17 1112 Oral     SpO2 12/08/17 1112 93 %     Weight 12/08/17 1113 198 lb (89.8 kg)     Height 12/08/17 1113 5\' 2"  (1.575 m)     Head Circumference --      Peak Flow --      Pain Score 12/08/17 1113 0     Pain Loc --      Pain Edu? --      Excl. in GC? --    No data found.  Updated Vital Signs BP (!) 171/83 (BP Location: Right Arm)   Pulse 78  Temp 98.8 F (37.1 C) (Oral)   Resp 18   Ht 5\' 2"  (1.575 m)   Wt 198 lb (89.8 kg)   SpO2 93%   BMI 36.21 kg/m   Visual Acuity Right Eye Distance:   Left Eye Distance:   Bilateral Distance:    Right Eye Near:   Left Eye Near:    Bilateral Near:     Physical Exam Overweight lady, pleasant.  Constantly dabbing her nose.  TMs are normal except for a little old tympanosclerosis on the right eardrum.  Eyes PRL.  Nose very congested.  Throat without erythema.  Neck supple without significant nodes.  She is tender in both maxillary and frontal regions.  Her chest is clear to auscultation without rhonchi, rales, or wheezes.  Heart regular without murmurs.  Skin warm and dry.  UC Treatments / Results  Labs (all labs ordered are listed, but only abnormal results are displayed) Labs Reviewed - No data to display  EKG None Radiology No results found.  Procedures Procedures (including critical care time)  Medications Ordered in UC Medications - No data to display   Initial Impression / Assessment and Plan / UC Course  I have reviewed the triage vital signs and the nursing notes.  Pertinent labs & imaging results that were available during my care of the patient were reviewed by me and considered in my medical decision making (see chart for details).     Probably started with allergic rhinitis and now has a secondary sinusitis.  We will treat accordingly.  Final Clinical  Impressions(s) / UC Diagnoses   Final diagnoses:  None    ED Discharge Orders    None     Drink plenty of fluids and get enough rest  Use the fluticasone nose spray 2 sprays each nostril twice daily for 3 days, then reduce to once daily  Take the Augmentin 875 mg (amoxicillin/clavulanate) 1 twice daily for infection  Take an antihistamine decongestant such as Claritin-D or Allegra-D to try and open up your head more.  Stay off from work through tomorrow.  Return as needed  Controlled Substance Prescriptions [place] Controlled Substance Registry consulted? No   Peyton NajjarHopper, David H, MD 12/08/17 [id number]

## 2017-12-30 ENCOUNTER — Other Ambulatory Visit: Payer: Self-pay | Admitting: Family Medicine

## 2018-12-19 ENCOUNTER — Telehealth: Payer: Self-pay | Admitting: Neurology

## 2018-12-19 NOTE — Telephone Encounter (Signed)
Patient was on recall list for being overdue on DM follow up. She has transferred care and is no longer a patient in this practice. Jennifer Conrad removed as PCP.  

## 2020-07-28 ENCOUNTER — Emergency Department (INDEPENDENT_AMBULATORY_CARE_PROVIDER_SITE_OTHER)
Admission: EM | Admit: 2020-07-28 | Discharge: 2020-07-28 | Disposition: A | Discharge status: Home / Self Care | Payer: BC Managed Care – PPO | Source: Home / Self Care

## 2020-07-28 ENCOUNTER — Ambulatory Visit: Payer: Self-pay

## 2020-07-28 DIAGNOSIS — R0600 Dyspnea, unspecified: Secondary | ICD-10-CM

## 2020-07-28 DIAGNOSIS — R079 Chest pain, unspecified: Secondary | ICD-10-CM

## 2020-07-28 DIAGNOSIS — R0902 Hypoxemia: Secondary | ICD-10-CM

## 2020-07-28 DIAGNOSIS — R059 Cough, unspecified: Secondary | ICD-10-CM

## 2020-07-28 LAB — POCT FASTING CBG KUC MANUAL ENTRY: POCT Glucose (KUC): 218 mg/dL — AB (ref 70–99)

## 2020-07-28 MED ORDER — ASPIRIN 81 MG PO CHEW
243.0000 mg | CHEWABLE_TABLET | Freq: Once | ORAL | Status: AC
  Administered 2020-07-28: 243 mg via ORAL

## 2020-07-28 NOTE — Discharge Instructions (Addendum)
You are being referred to the emergency department for further work-up and evaluation as you are hypoxic on arrival here at urgent care.  Hypoxia means that your blood is not oxygenating well without secondary oxygen.  This requires further work-up and evaluation in the setting of the emergency department.

## 2020-07-28 NOTE — ED Triage Notes (Signed)
Pt tested for covid 2 weeks ago for exposure. PCR Tested neg. Sinus congestion. Shortness of breath started yesterday. Last night states she was afraid to lay down. Chest pain in center of chest and also in middle of back. Ibuprofen this am.  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 Has had covid vaccinations. Booster on 11/5.

## 2020-07-28 NOTE — ED Notes (Signed)
Pt blood sugar 218. Says she took her insulin right before coming. Last meal was at 8am

## 2020-07-28 NOTE — ED Provider Notes (Signed)
[facility]    CSN: [id number] Arrival date & time: 07/28/20  1016      History   Chief Complaint Chief Complaint  Patient presents with   Chest Pain   Shortness of Breath    HPI [facility] is a 59 y.o. female.   HPI  Patient with a medical history significant for hypertension, morbid obesity, diabetes presents with 3 days of shortness of breath and chest pain which started yesterday.  Patient reports overall symptoms were initially thought to be related to a URI illness.  However over the last 3 days shortness of breath has gradually progressed and includes dizziness and feeling off balance.  Last night she reports that she feels as if she was drowning and was unable to lay in the bed as shortness of breath worsened.  This episode last night prompted her to come in to be evaluated.  On arrival patient's oxygen level was 84% and improved to 95% on 3 L of oxygen.  Her blood pressure is 183/92 pulse rate 79.  She has some mild increased work of breathing on exam.  Patient denies any underlying COPD, asthma or CHF.  Blood sugars have been up and down over the last few days since onset of illness.  Patient denies Covid as she is recently been tested and tested negative and also she recently had her Covid booster on 07/11/2020.  Past Medical History:  Diagnosis Date   Depression    Diabetes mellitus    Hypertension    Obesity (BMI 30-39.9)    alowly losing weight/smaller portions/exercise 3 X week    Patient Active Problem List   Diagnosis Date Noted   Right shoulder pain 10/29/2014   S/P appendectomy 01/02/2014   GERD (gastroesophageal reflux disease) 11/25/2013   OAB (overactive bladder) 11/25/2013   Depression 02/04/2012   Diabetes mellitus type II 02/04/2012   Major depressive disorder, recurrent episode, moderate (HCC) 09/15/2011   Generalized anxiety disorder 09/15/2011   BINGE EATING DISORDER 06/10/2008   OBSTRUCTIVE SLEEP APNEA  06/10/2008   MIGRAINE UNSP W/O INTRACT W/O STATUS MIGRAINOSUS 06/10/2008   BENIGN POSITIONAL VERTIGO 06/10/2008   ESSENTIAL HYPERTENSION, BENIGN 06/10/2008   ALLERGIC RHINITIS 06/10/2008   Diabetes type 2, uncontrolled (HCC) 05/22/2008   DEPRESSION/ANXIETY 05/22/2008   TRANSAMINASES, SERUM, ELEVATED 05/22/2008    Past Surgical History:  Procedure Laterality Date   ABDOMINAL HYSTERECTOMY     APPENDECTOMY     CARPAL TUNNEL RELEASE  10/2011   CHOLECYSTECTOMY      OB History   No obstetric history on file.      Home Medications    Prior to Admission medications   Medication Sig Start Date End Date Taking? Authorizing Provider  buPROPion (WELLBUTRIN XL) 300 MG 24 hr tablet TAKE 1 TABLET BY MOUTH EVERY DAY IN THE MORNING 07/18/20  Yes [provider]  albuterol (PROVENTIL HFA;VENTOLIN HFA) 108 (90 Base) MCG/ACT inhaler Inhale 2 puffs into the lungs every 4 (four) hours as needed for wheezing or shortness of breath. 12/20/16   Lattie HawBeese, Stephen A, MD  AMBULATORY NON FORMULARY MEDICATION Medication Name: One touch ultra mini strips  To test blood sugar three times a day. 06/30/12   Breeback, Jade L, PA-C  amLODipine-benazepril (LOTREL) 10-40 MG capsule Take 1 capsule by mouth daily.    [provider]  amoxicillin-clavulanate (AUGMENTIN) 875-125 MG tablet Take 1 tablet by mouth every 12 (twelve) hours. 12/08/17   Peyton NajjarHopper, David H, MD  atenolol (TENORMIN) 25 MG  tablet TAKE 1 TABLET BY MOUTH 2 TIMES DAILY. SCHEDULE APPOINTMENT FOR FURTHER REFILLS 08/29/15   Tandy Gaw L, PA-C  atorvastatin (LIPITOR) 20 MG tablet Take 20 mg by mouth daily. 07/23/20   [provider]  benzonatate (TESSALON) 200 MG capsule Take one cap by mouth at bedtime as needed for cough.  May repeat in 4 to 6 hours 12/20/16   Lattie Haw, MD  buPROPion (WELLBUTRIN XL) 150 MG 24 hr tablet Take 2 tablets (300 mg total) by mouth daily. Patient needs to schedule a follow up appointment  before more refills. 06/11/15   Breeback, Lonna Cobb, PA-C  canagliflozin (INVOKANA) 100 MG TABS tablet Take 1 tablet by mouth every day **OFFICE APPOINTMENT NEEDED FOR REFILLS** Patient not taking: Reported on 11/03/2015 08/27/14   Jomarie Longs, PA-C  doxycycline (VIBRAMYCIN) 100 MG capsule Take 1 capsule (100 mg total) by mouth 2 (two) times daily. Take with food. 09/21/16   Lattie Haw, MD  escitalopram (LEXAPRO) 20 MG tablet TAKE 1 TABLET BY MOUTH DAILY 04/24/15   Caleen Essex, Jade L, PA-C  fluticasone (FLONASE) 50 MCG/ACT nasal spray Use 2 sprays each nostril twice daily for 3 days, then reduce to once daily 12/08/17   Peyton Najjar, MD  HYDROcodone-acetaminophen (NORCO/VICODIN) 5-325 MG per tablet Take one by mouth at bedtime as needed for pain Patient not taking: Reported on 11/03/2015 11/18/14   Lattie Haw, MD  metFORMIN (GLUCOPHAGE) 1000 MG tablet TAKE 1 TABLET (1,000 MG TOTAL) BY MOUTH 3 (THREE) TIMES DAILY WITH MEALS. Patient not taking: Reported on 11/03/2015 04/24/15   Jomarie Longs, PA-C  metFORMIN (GLUCOPHAGE) 1000 MG tablet Take 1 tablet (1,000 mg total) by mouth 3 (three) times daily with meals. Patient not taking: Reported on 11/03/2015 06/11/15   Breeback, Jade L, PA-C  Olmesartan-Amlodipine-HCTZ 40-10-25 MG TABS TAKE 1 TABLET BY MOUTH DAILY. NEED FOLLOW UP APPOINTMENT FOR MORE REFILLS 11/07/15   Jomarie Longs, PA-C  oseltamivir (TAMIFLU) 75 MG capsule Take 1 capsule (75 mg total) by mouth every 12 (twelve) hours. 12/20/16   Lattie Haw, MD  oxybutynin (DITROPAN-XL) 10 MG 24 hr tablet TAKE 1 TABLET (10 MG TOTAL) BY MOUTH DAILY. 10/14/15   Breeback, Jade L, PA-C  pantoprazole (PROTONIX) 40 MG tablet Take 1 tablet (40 mg total) by mouth daily. 10/26/14   Breeback, Lonna Cobb, PA-C  traMADol (ULTRAM) 50 MG tablet Take 1 tablet (50 mg total) by mouth every 6 (six) hours as needed for pain. Patient not taking: Reported on 11/03/2015 02/19/13   Floydene Flock, MD  triamcinolone ointment  (KENALOG) 0.5 % Apply 1 application topically 2 (two) times daily. 08/14/13   Jomarie Longs, PA-C    Family History Family History  Problem Relation Age of Onset   Depression Sister    Drug abuse Cousin    Depression Sister    Diabetes Mother    Cancer Mother    Cancer Father     Social History Social History   Tobacco Use   Smoking status: Never Smoker   Smokeless tobacco: Never Used  Building services engineer Use: Never used  Substance Use Topics   Alcohol use: No   Drug use: No     Allergies   Ace inhibitors and Codeine   Review of Systems Review of Systems Pertinent negatives listed in HPI  Physical Exam Triage Vital Signs ED Triage Vitals  Enc Vitals Group     BP 07/28/20 1024 Marland Kitchen)  183/92     Pulse Rate 07/28/20 1024 79     Resp 07/28/20 1024 16     Temp --      Temp src --      SpO2 07/28/20 1024 (!) 80 %     Weight --      Height --      Head Circumference --      Peak Flow --      Pain Score 07/28/20 1030 5     Pain Loc --      Pain Edu? --      Excl. in GC? --    No data found.  Updated Vital Signs BP (!) 183/92 (BP Location: Right Arm)    Pulse 79    Resp 16    SpO2 96%   Visual Acuity Right Eye Distance:   Left Eye Distance:   Bilateral Distance:    Right Eye Near:   Left Eye Near:    Bilateral Near:     Physical Exam Constitutional:      Appearance: She is obese.  Cardiovascular:     Rate and Rhythm: Normal rate and regular rhythm.     Heart sounds: Normal heart sounds.  Pulmonary:     Comments: Increased work of breathing noted Patient hypoxic which improved to 95% with 3 L of oxygen Musculoskeletal:     Right lower leg: Edema present.     Left lower leg: Edema present.  Neurological:     General: No focal deficit present.     Mental Status: She is alert and oriented to person, place, and time.     GCS: GCS eye subscore is 4. GCS verbal subscore is 5. GCS motor subscore is 6.  Psychiatric:        Attention and  Perception: Attention and perception normal.        Mood and Affect: Mood normal.        Speech: Speech normal.        Behavior: Behavior is cooperative.      UC Treatments / Results  Labs (all labs ordered are listed, but only abnormal results are displayed) Labs Reviewed  POCT FASTING CBG [facility] MANUAL ENTRY - Abnormal; Notable for the following components:      Result Value   POCT Glucose ([facility]) 218 (*)    All other components within normal limits    EKG Normal sinus rhythm with prolonged QT, rate 76 no ST changes  Radiology No results found.  Procedures Procedures (including critical care time)  Medications Ordered in UC Medications  aspirin chewable tablet 243 mg (243 mg Oral Given 07/28/20 1057)    Initial Impression / Assessment and Plan / UC Course  I have reviewed the triage vital signs and the nursing notes.  Pertinent labs & imaging results that were available during my care of the patient were reviewed by me and considered in my medical decision making (see chart for details).    Given hypoxia and chest pain patient is being referred to the ER for further work-up and evaluation which is the appropriate setting opposed to urgent care.  EMS called patient was on 3 L of oxygen once they arrived patient was transferred to their oxygen tank and remained stable.  Patient was also given 3 doses of 81 mg ASA as she had taken 1 dose of aspirin earlier today.  Patient is alert and oriented and is being transported via EMS.  Patient notified family. Final Clinical Impressions(s) /  UC Diagnoses   Final diagnoses:  Dyspnea, unspecified type  Hypoxia  Chest pain, unspecified type  Cough     Discharge Instructions     You are being referred to the emergency department for further work-up and evaluation as you are hypoxic on arrival here at urgent care.  Hypoxia means that your blood is not oxygenating well without secondary oxygen.  This requires further work-up and  evaluation in the setting of the emergency department.   ED Prescriptions    None     PDMP not reviewed this encounter.   Bing Neighbors, FNP 07/28/20 1128

## 2020-07-28 NOTE — ED Notes (Signed)
Patient is being discharged from the Urgent Care and sent to the Emergency Department via EMS . Per Jerrilyn Cairo FNP, patient is in need of higher level of care due to shortness of breath and CP. Pt put on 3L of 02, o2 up to 96% on oxygen Patient is aware and verbalizes understanding of plan of care.  Vitals:   07/28/20 1024 07/28/20 1034  BP: (!) 183/92   Pulse: 79   Resp: 16   SpO2: (!) 80% 96%

## 2023-05-06 ENCOUNTER — Ambulatory Visit
Admission: EM | Admit: 2023-05-06 | Discharge: 2023-05-06 | Disposition: A | Discharge status: Home / Self Care | Payer: BC Managed Care – PPO | Source: Home / Self Care | Attending: Family Medicine | Admitting: Family Medicine

## 2023-05-06 DIAGNOSIS — J0101 Acute recurrent maxillary sinusitis: Secondary | ICD-10-CM | POA: Diagnosis not present

## 2023-05-06 DIAGNOSIS — J069 Acute upper respiratory infection, unspecified: Secondary | ICD-10-CM

## 2023-05-06 LAB — POC SARS CORONAVIRUS 2 AG -  ED: SARS Coronavirus 2 Ag: NEGATIVE

## 2023-05-06 MED ORDER — AMOXICILLIN-POT CLAVULANATE 875-125 MG PO TABS: 1.0000 | ORAL_TABLET | Freq: Two times a day (BID) | ORAL | 0 refills | Status: DC

## 2023-05-06 NOTE — ED Provider Notes (Signed)
[name]    CSN: [id number] Arrival date & time: 05/06/23  0940      History   Chief Complaint Chief Complaint  Patient presents with   Nasal Congestion    HPI ERON SASALA is a 62 y.o. female.   Three days ago patient developed sore throat, nasal congestion, headache, fatigue, and shortness of breath. She has minimal cough.  She had a negative home COVID test three days ago.  She has a history of both PE and pneumonia.  She notes that her SpO2 last night was 86%  The history is provided by the patient.    Past Medical History:  Diagnosis Date   Depression    Diabetes mellitus    Hypertension    Obesity (BMI 30-39.9)    alowly losing weight/smaller portions/exercise 3 X week    Patient Active Problem List   Diagnosis Date Noted   Right shoulder pain 10/29/2014   S/P appendectomy 01/02/2014   GERD (gastroesophageal reflux disease) 11/25/2013   OAB (overactive bladder) 11/25/2013   Depression 02/04/2012   Diabetes mellitus type II 02/04/2012   Major depressive disorder, recurrent episode, moderate (HCC) 09/15/2011   Generalized anxiety disorder 09/15/2011   BINGE EATING DISORDER 06/10/2008   OBSTRUCTIVE SLEEP APNEA 06/10/2008   MIGRAINE UNSP W/O INTRACT W/O STATUS MIGRAINOSUS 06/10/2008   BENIGN POSITIONAL VERTIGO 06/10/2008   ESSENTIAL HYPERTENSION, BENIGN 06/10/2008   ALLERGIC RHINITIS 06/10/2008   Diabetes type 2, uncontrolled 05/22/2008   DEPRESSION/ANXIETY 05/22/2008   TRANSAMINASES, SERUM, ELEVATED 05/22/2008    Past Surgical History:  Procedure Laterality Date   ABDOMINAL HYSTERECTOMY     APPENDECTOMY     CARPAL TUNNEL RELEASE  10/2011   CHOLECYSTECTOMY      OB History   No obstetric history on file.      Home Medications    Prior to Admission medications   Medication Sig Start Date End Date Taking? Authorizing Provider  ferrous sulfate 325 (65 FE) MG EC tablet Take by mouth. 09/17/22  Yes [provider]   furosemide (LASIX) 40 MG tablet Take 1 tablet by mouth 2 (two) times daily. 10/12/21  Yes [provider]  gabapentin (NEURONTIN) 300 MG capsule Take 1 capsule by mouth 3 (three) times daily. 08/12/22  Yes [provider]  XARELTO 20 MG TABS tablet Take 1 tablet by mouth daily. 02/22/23  Yes [provider]  XIGDUO XR 01-999 MG TB24 TAKE ONE TABLET BY MOUTH 2 (TWO) TIMES A DAY WITH MEALS. 02/24/23  Yes [provider]  albuterol (PROVENTIL HFA;VENTOLIN HFA) 108 (90 Base) MCG/ACT inhaler Inhale 2 puffs into the lungs every 4 (four) hours as needed for wheezing or shortness of breath. 12/20/16   Lattie Haw, MD  AMBULATORY NON FORMULARY MEDICATION Medication Name: One touch ultra mini strips  To test blood sugar three times a day. 06/30/12   Breeback, Jade L, PA-C  amLODipine-benazepril (LOTREL) 10-40 MG capsule Take 1 capsule by mouth daily.    [provider]  amoxicillin-clavulanate (AUGMENTIN) 875-125 MG tablet Take 1 tablet by mouth every 12 (twelve) hours. Take with food 05/06/23   Lattie Haw, MD  atenolol (TENORMIN) 25 MG tablet TAKE 1 TABLET BY MOUTH 2 TIMES DAILY. SCHEDULE APPOINTMENT FOR FURTHER REFILLS 08/29/15   Tandy Gaw L, PA-C  atorvastatin (LIPITOR) 20 MG tablet Take 20 mg by mouth daily. 07/23/20   [provider]  benzonatate (TESSALON) 200 MG capsule Take one cap by mouth at bedtime as  needed for cough.  May repeat in 4 to 6 hours 12/20/16   Lattie Haw, MD  buPROPion (WELLBUTRIN XL) 150 MG 24 hr tablet Take 2 tablets (300 mg total) by mouth daily. Patient needs to schedule a follow up appointment before more refills. 06/11/15   Breeback, Jade L, PA-C  buPROPion (WELLBUTRIN XL) 300 MG 24 hr tablet TAKE 1 TABLET BY MOUTH EVERY DAY IN THE MORNING 07/18/20   [provider]  canagliflozin (INVOKANA) 100 MG TABS tablet Take 1 tablet by mouth every day **OFFICE APPOINTMENT NEEDED FOR REFILLS** Patient not taking:  Reported on 11/03/2015 08/27/14   Jomarie Longs, PA-C  escitalopram (LEXAPRO) 20 MG tablet TAKE 1 TABLET BY MOUTH DAILY 04/24/15   Breeback, Jade L, PA-C  fluticasone (FLONASE) 50 MCG/ACT nasal spray Use 2 sprays each nostril twice daily for 3 days, then reduce to once daily 12/08/17   Peyton Najjar, MD  HYDROcodone-acetaminophen (NORCO/VICODIN) 5-325 MG per tablet Take one by mouth at bedtime as needed for pain Patient not taking: Reported on 11/03/2015 11/18/14   Lattie Haw, MD  metFORMIN (GLUCOPHAGE) 1000 MG tablet TAKE 1 TABLET (1,000 MG TOTAL) BY MOUTH 3 (THREE) TIMES DAILY WITH MEALS. Patient not taking: Reported on 11/03/2015 04/24/15   Jomarie Longs, PA-C  metFORMIN (GLUCOPHAGE) 1000 MG tablet Take 1 tablet (1,000 mg total) by mouth 3 (three) times daily with meals. Patient not taking: Reported on 11/03/2015 06/11/15   Breeback, Jade L, PA-C  Olmesartan-Amlodipine-HCTZ 40-10-25 MG TABS TAKE 1 TABLET BY MOUTH DAILY. NEED FOLLOW UP APPOINTMENT FOR MORE REFILLS 11/07/15   Jomarie Longs, PA-C  oseltamivir (TAMIFLU) 75 MG capsule Take 1 capsule (75 mg total) by mouth every 12 (twelve) hours. 12/20/16   Lattie Haw, MD  oxybutynin (DITROPAN-XL) 10 MG 24 hr tablet TAKE 1 TABLET (10 MG TOTAL) BY MOUTH DAILY. 10/14/15   Breeback, Jade L, PA-C  pantoprazole (PROTONIX) 40 MG tablet Take 1 tablet (40 mg total) by mouth daily. 10/26/14   Breeback, Lonna Cobb, PA-C  traMADol (ULTRAM) 50 MG tablet Take 1 tablet (50 mg total) by mouth every 6 (six) hours as needed for pain. Patient not taking: Reported on 11/03/2015 02/19/13   Floydene Flock, MD  triamcinolone ointment (KENALOG) 0.5 % Apply 1 application topically 2 (two) times daily. 08/14/13   Jomarie Longs, PA-C    Family History Family History  Problem Relation Age of Onset   Depression Sister    Drug abuse Cousin    Depression Sister    Diabetes Mother    Cancer Mother    Cancer Father     Social History Social History   Tobacco Use    Smoking status: Never   Smokeless tobacco: Never  Vaping Use   Vaping status: Never Used  Substance Use Topics   Alcohol use: No   Drug use: No     Allergies   Ace inhibitors and Codeine   Review of Systems Review of Systems + sore throat + cough No pleuritic pain No wheezing + nasal congestion ? post-nasal drainage + sinus pain/pressure No itchy/red eyes No earache No hemoptysis + SOB No fever No nausea No vomiting No abdominal pain No diarrhea No urinary symptoms No skin rash + fatigue No myalgias + headache Used OTC meds (Tylenol) without relief   Physical Exam Triage Vital Signs ED Triage Vitals  Encounter Vitals Group     BP 05/06/23 1010 (!) 183/94  Systolic BP Percentile --      Diastolic BP Percentile --      Pulse Rate 05/06/23 1010 84     Resp 05/06/23 1010 17     Temp 05/06/23 1010 98.7 F (37.1 C)     Temp Source 05/06/23 1010 Oral     SpO2 05/06/23 1010 91 %     Weight --      Height --      Head Circumference --      Peak Flow --      Pain Score 05/06/23 1012 0     Pain Loc --      Pain Education --      Exclude from Growth Chart --    No data found.  Updated Vital Signs BP (!) 183/94 (BP Location: Right Arm)   Pulse 84   Temp 98.7 F (37.1 C) (Oral)   Resp 17   SpO2 91%   Visual Acuity Right Eye Distance:   Left Eye Distance:   Bilateral Distance:    Right Eye Near:   Left Eye Near:    Bilateral Near:     Physical Exam Nursing notes and Vital Signs reviewed. Appearance:  Patient appears stated age, and in no acute distress Eyes:  Pupils are equal, round, and reactive to light and accomodation.  Extraocular movement is intact.  Conjunctivae are not inflamed  Ears:  Canals normal.  Tympanic membranes normal.  Nose:  Congested turbinates. Bilateral maxillary sinus tenderness is present.  Pharynx:  Normal Neck:  Supple.  Mildly enlarged lateral nodes are present, tender to palpation on the left.   Lungs:  Clear  to auscultation.  Breath sounds are equal.  Moving air well. Heart:  Regular rate and rhythm without murmurs, rubs, or gallops.  Abdomen:  Nontender without masses or hepatosplenomegaly.  Bowel sounds are present.  No CVA or flank tenderness.  Extremities:  No edema.  Skin:  No rash present.   UC Treatments / Results  Labs (all labs ordered are listed, but only abnormal results are displayed) Labs Reviewed  POC SARS CORONAVIRUS 2 AG -  ED negative    EKG   Radiology No results found.  Procedures Procedures (including critical care time)  Medications Ordered in UC Medications - No data to display  Initial Impression / Assessment and Plan / UC Course  I have reviewed the triage vital signs and the nursing notes.  Pertinent labs & imaging results that were available during my care of the patient were reviewed by me and considered in my medical decision making (see chart for details).    Begin Augmentin. Followup with Family Doctor if not improved in one week.   Final Clinical Impressions(s) / UC Diagnoses   Final diagnoses:  Viral URI with cough  Acute recurrent maxillary sinusitis     Discharge Instructions      Repeat a home COVID test tomorrow. Take plain guaifenesin (1200mg  extended release tabs such as Mucinex) twice daily, with plenty of water, for cough and congestion.  Get adequate rest.   May use Afrin nasal spray (or generic oxymetazoline) each morning for about 5 days and then discontinue.  Also recommend using saline nasal spray several times daily and saline nasal irrigation (AYR is a common brand).  Use Flonase nasal spray each morning after using Afrin nasal spray and saline nasal irrigation. Try warm salt water gargles for sore throat.  Stop all antihistamines for now, and other non-prescription cough/cold preparations. May take  Delsym Cough Suppressant ("12 Hour Cough Relief") at bedtime for nighttime cough.    If symptoms become significantly worse  during the night or over the weekend, proceed to the local emergency room.     ED Prescriptions     Medication Sig Dispense Auth. Provider   amoxicillin-clavulanate (AUGMENTIN) 875-125 MG tablet Take 1 tablet by mouth every 12 (twelve) hours. Take with food 14 tablet Lattie Haw, MD         Lattie Haw, MD 05/07/23 [phone]

## 2023-05-06 NOTE — Discharge Instructions (Signed)
Repeat a home COVID test tomorrow. Take plain guaifenesin (1200mg  extended release tabs such as Mucinex) twice daily, with plenty of water, for cough and congestion.  Get adequate rest.   May use Afrin nasal spray (or generic oxymetazoline) each morning for about 5 days and then discontinue.  Also recommend using saline nasal spray several times daily and saline nasal irrigation (AYR is a common brand).  Use Flonase nasal spray each morning after using Afrin nasal spray and saline nasal irrigation. Try warm salt water gargles for sore throat.  Stop all antihistamines for now, and other non-prescription cough/cold preparations. May take Delsym Cough Suppressant ("12 Hour Cough Relief") at bedtime for nighttime cough.    If symptoms become significantly worse during the night or over the weekend, proceed to the local emergency room.

## 2023-05-06 NOTE — ED Triage Notes (Addendum)
Pt c/o nasal congestion, shortness of breath and HA since Tuesday. COVID neg at home Tuesday but would like to be retested. Hx of pneumonia and PE.

## 2023-08-14 ENCOUNTER — Encounter: Payer: Self-pay | Admitting: Emergency Medicine

## 2023-08-14 ENCOUNTER — Ambulatory Visit
Admission: EM | Admit: 2023-08-14 | Discharge: 2023-08-14 | Disposition: A | Discharge status: Home / Self Care | Payer: BC Managed Care – PPO | Attending: Family Medicine | Admitting: Family Medicine

## 2023-08-14 ENCOUNTER — Other Ambulatory Visit: Payer: Self-pay

## 2023-08-14 DIAGNOSIS — E119 Type 2 diabetes mellitus without complications: Secondary | ICD-10-CM | POA: Diagnosis not present

## 2023-08-14 DIAGNOSIS — G4733 Obstructive sleep apnea (adult) (pediatric): Secondary | ICD-10-CM | POA: Insufficient documentation

## 2023-08-14 DIAGNOSIS — E669 Obesity, unspecified: Secondary | ICD-10-CM | POA: Diagnosis not present

## 2023-08-14 DIAGNOSIS — I10 Essential (primary) hypertension: Secondary | ICD-10-CM | POA: Diagnosis not present

## 2023-08-14 DIAGNOSIS — J029 Acute pharyngitis, unspecified: Secondary | ICD-10-CM

## 2023-08-14 DIAGNOSIS — K219 Gastro-esophageal reflux disease without esophagitis: Secondary | ICD-10-CM | POA: Insufficient documentation

## 2023-08-14 DIAGNOSIS — J039 Acute tonsillitis, unspecified: Secondary | ICD-10-CM | POA: Diagnosis not present

## 2023-08-14 DIAGNOSIS — Z6830 Body mass index (BMI) 30.0-30.9, adult: Secondary | ICD-10-CM | POA: Diagnosis not present

## 2023-08-14 LAB — POC SARS CORONAVIRUS 2 AG -  ED: SARS Coronavirus 2 Ag: NEGATIVE

## 2023-08-14 LAB — POCT INFLUENZA A/B
Influenza A, POC: NEGATIVE
Influenza B, POC: NEGATIVE

## 2023-08-14 LAB — POCT RAPID STREP A (OFFICE): Rapid Strep A Screen: NEGATIVE

## 2023-08-14 MED ORDER — DEXAMETHASONE 6 MG PO TABS
10.0000 mg | ORAL_TABLET | Freq: Once | ORAL | Status: AC
  Administered 2023-08-14: 10 mg via ORAL

## 2023-08-14 MED ORDER — AMOXICILLIN 500 MG PO CAPS: 500.0000 mg | ORAL_CAPSULE | Freq: Two times a day (BID) | ORAL | 0 refills | Status: DC

## 2023-08-14 NOTE — ED Triage Notes (Signed)
Patient presents to Urgent Care with complaints of sore throat, headache, body aches, since 1 day ago. 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 Took Tylenol this morning around 3 am.

## 2023-08-14 NOTE — Discharge Instructions (Signed)
The throat swab has been sent to the laboratory for culture.  This result will be available in 2 to 3 days.  Check MyChart for the result If the throat culture is negative you may stop the antibiotics as soon as you feel better.  If the throat culture is positive, you need to take 10 full days of antibiotics Make sure you are drinking lots of fluids Take Tylenol or ibuprofen for pain and fever

## 2023-08-14 NOTE — ED Provider Notes (Signed)
[name]    CSN: [id number] Arrival date & time: 08/14/23  1129      History   Chief Complaint Chief Complaint  Patient presents with   Sore Throat    HPI Jennifer Conrad is a 62 y.o. female.   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  Currently has a very painful sore throat.  This been going on for several days.  Minor head congestion and cough.  No fever or chills.  No body aches.  Is feeling tired. Patient has multiple medical problems including diabetes GERD obstructive sleep apnea, hypertension, environmental allergies    Past Medical History:  Diagnosis Date   Depression    Diabetes mellitus    Hypertension    Obesity (BMI 30-39.9)    alowly losing weight/smaller portions/exercise 3 X week    Patient Active Problem List   Diagnosis Date Noted   Right shoulder pain 10/29/2014   S/P appendectomy 01/02/2014   GERD (gastroesophageal reflux disease) 11/25/2013   OAB (overactive bladder) 11/25/2013   Depression 02/04/2012   Diabetes mellitus, type II (HCC) 02/04/2012   Major depressive disorder, recurrent episode, moderate (HCC) 09/15/2011   Generalized anxiety disorder 09/15/2011   BINGE EATING DISORDER 06/10/2008   OBSTRUCTIVE SLEEP APNEA 06/10/2008   Migraine headache 06/10/2008   BENIGN POSITIONAL VERTIGO 06/10/2008   ESSENTIAL HYPERTENSION, BENIGN 06/10/2008   Allergic rhinitis 06/10/2008   Diabetes type 2, uncontrolled 05/22/2008   DEPRESSION/ANXIETY 05/22/2008   TRANSAMINASES, SERUM, ELEVATED 05/22/2008    Past Surgical History:  Procedure Laterality Date   ABDOMINAL HYSTERECTOMY     APPENDECTOMY     CARPAL TUNNEL RELEASE  10/2011   CHOLECYSTECTOMY      OB History   No obstetric history on file.      Home Medications    Prior to Admission medications   Medication Sig Start Date End Date Taking? Authorizing Provider  albuterol (PROVENTIL HFA;VENTOLIN HFA) 108 (90  Base) MCG/ACT inhaler Inhale 2 puffs into the lungs every 4 (four) hours as needed for wheezing or shortness of breath. 12/20/16  Yes Lattie Haw, MD  AMBULATORY NON FORMULARY MEDICATION Medication Name: One touch ultra mini strips  To test blood sugar three times a day. 06/30/12  Yes Breeback, Jade L, PA-C  amLODipine-benazepril (LOTREL) 10-40 MG capsule Take 1 capsule by mouth daily.   Yes [provider]  amoxicillin (AMOXIL) 500 MG capsule Take 1 capsule (500 mg total) by mouth 2 (two) times daily. 08/14/23  Yes Eustace Moore, MD  atorvastatin (LIPITOR) 20 MG tablet Take 20 mg by mouth daily. 07/23/20  Yes [provider]  buPROPion (WELLBUTRIN XL) 150 MG 24 hr tablet Take 2 tablets (300 mg total) by mouth daily. Patient needs to schedule a follow up appointment before more refills. 06/11/15  Yes Breeback, Jade L, PA-C  buPROPion (WELLBUTRIN XL) 300 MG 24 hr tablet TAKE 1 TABLET BY MOUTH EVERY DAY IN THE MORNING 07/18/20  Yes [provider]  escitalopram (LEXAPRO) 20 MG tablet TAKE 1 TABLET BY MOUTH DAILY 04/24/15  Yes Breeback, Jade L, PA-C  ferrous sulfate 325 (65 FE) MG EC tablet Take by mouth. 09/17/22  Yes [provider]  fluconazole (DIFLUCAN) 150 MG tablet Take by mouth. 08/11/23  Yes [provider]  fluticasone (FLONASE) 50 MCG/ACT nasal spray Use 2 sprays each nostril twice daily for 3 days, then reduce to once daily 12/08/17  Yes Hopper,  Sandria Bales, MD  furosemide (LASIX) 40 MG tablet Take 1 tablet by mouth 2 (two) times daily. 10/12/21  Yes [provider]  gabapentin (NEURONTIN) 300 MG capsule Take 1 capsule by mouth 3 (three) times daily. 08/12/22  Yes [provider]  HYDROcodone-acetaminophen (NORCO/VICODIN) 5-325 MG per tablet Take one by mouth at bedtime as needed for pain 11/18/14  Yes Lattie Haw, MD  Olmesartan-Amlodipine-HCTZ 40-10-25 MG TABS TAKE 1 TABLET BY MOUTH DAILY. NEED FOLLOW UP APPOINTMENT FOR MORE  REFILLS 11/07/15  Yes Breeback, Jade L, PA-C  oxybutynin (DITROPAN-XL) 10 MG 24 hr tablet TAKE 1 TABLET (10 MG TOTAL) BY MOUTH DAILY. 10/14/15  Yes Breeback, Jade L, PA-C  pantoprazole (PROTONIX) 40 MG tablet Take 1 tablet (40 mg total) by mouth daily. 10/26/14  Yes Breeback, Jade L, PA-C  traMADol (ULTRAM) 50 MG tablet Take 1 tablet (50 mg total) by mouth every 6 (six) hours as needed for pain. 02/19/13  Yes Floydene Flock, MD  triamcinolone ointment (KENALOG) 0.5 % Apply 1 application topically 2 (two) times daily. 08/14/13  Yes Breeback, Jade L, PA-C  XARELTO 20 MG TABS tablet Take 1 tablet by mouth daily. 02/22/23  Yes [provider]  XIGDUO XR 01-999 MG TB24 TAKE ONE TABLET BY MOUTH 2 (TWO) TIMES A DAY WITH MEALS. 02/24/23  Yes [provider]    Family History Family History  Problem Relation Age of Onset   Depression Sister    Drug abuse Cousin    Depression Sister    Diabetes Mother    Cancer Mother    Cancer Father     Social History Social History   Tobacco Use   Smoking status: Never   Smokeless tobacco: Never  Vaping Use   Vaping status: Never Used  Substance Use Topics   Alcohol use: No   Drug use: No     Allergies   Ace inhibitors and Codeine   Review of Systems Review of Systems  See HPI Physical Exam Triage Vital Signs ED Triage Vitals  Encounter Vitals Group     BP 08/14/23 1143 (!) 184/107     Systolic BP Percentile --      Diastolic BP Percentile --      Pulse Rate 08/14/23 1143 72     Resp 08/14/23 1143 16     Temp 08/14/23 1143 99 F (37.2 C)     Temp Source 08/14/23 1143 Oral     SpO2 08/14/23 1143 94 %     Weight --      Height --      Head Circumference --      Peak Flow --      Pain Score 08/14/23 1141 6     Pain Loc --      Pain Education --      Exclude from Growth Chart --    No data found.  Updated Vital Signs BP (!) 172/85 Comment: has not taken bp medicine today  Pulse 72   Temp 99 F (37.2 C) (Oral)    Resp 16   SpO2 94%     Physical Exam Constitutional:      General: She is not in acute distress.    Appearance: She is obese. She is ill-appearing.  HENT:     Head: Normocephalic and atraumatic.     Right Ear: Tympanic membrane normal.     Left Ear: Tympanic membrane normal.     Nose: No congestion or rhinorrhea.  Mouth/Throat:     Pharynx: Uvula midline. Pharyngeal swelling and posterior oropharyngeal erythema present.     Tonsils: No tonsillar exudate. 2+ on the right. 2+ on the left.  Eyes:     Conjunctiva/sclera: Conjunctivae normal.     Pupils: Pupils are equal, round, and reactive to light.  Cardiovascular:     Rate and Rhythm: Normal rate.  Pulmonary:     Effort: Pulmonary effort is normal. No respiratory distress.  Abdominal:     General: There is no distension.     Palpations: Abdomen is soft.  Musculoskeletal:        General: Normal range of motion.     Cervical back: Normal range of motion.  Lymphadenopathy:     Cervical: Cervical adenopathy present.  Skin:    General: Skin is warm and dry.  Neurological:     Mental Status: She is alert.      UC Treatments / Results  Labs (all labs ordered are listed, but only abnormal results are displayed) Labs Reviewed  CULTURE, GROUP A STREP [facility])  POCT INFLUENZA A/B  POC SARS CORONAVIRUS 2 AG -  ED  POCT RAPID STREP A (OFFICE)    EKG   Radiology No results found.  Procedures Procedures (including critical care time)  Medications Ordered in UC Medications  dexamethasone (DECADRON) tablet 10 mg (10 mg Oral Given 08/14/23 1236)    Initial Impression / Assessment and Plan / UC Course  I have reviewed the triage vital signs and the nursing notes.  Pertinent labs & imaging results that were available during my care of the patient were reviewed by me and considered in my medical decision making (see chart for details).     Patient has large painful tonsils.  Unusual for 62 year old.  Likely viral  illness but because of her painful swallowing and exposure to known strep will treat with antibiotics pending her throat culture report Final Clinical Impressions(s) / UC Diagnoses   Final diagnoses:  Sore throat  Elevated blood pressure reading with diagnosis of hypertension  Tonsillitis     Discharge Instructions      The throat swab has been sent to the laboratory for culture.  This result will be available in 2 to 3 days.  Check MyChart for the result If the throat culture is negative you may stop the antibiotics as soon as you feel better.  If the throat culture is positive, you need to take 10 full days of antibiotics Make sure you are drinking lots of fluids Take Tylenol or ibuprofen for pain and fever    ED Prescriptions     Medication Sig Dispense Auth. Provider   amoxicillin (AMOXIL) 500 MG capsule Take 1 capsule (500 mg total) by mouth 2 (two) times daily. 20 capsule Eustace Moore, MD      PDMP not reviewed this encounter.   Eustace Moore, MD 08/14/23 1248

## 2023-08-17 LAB — CULTURE, GROUP A STREP (THRC)

## 2023-10-24 ENCOUNTER — Ambulatory Visit
Admission: EM | Admit: 2023-10-24 | Discharge: 2023-10-24 | Disposition: A | Discharge status: Home / Self Care | Payer: BC Managed Care – PPO | Attending: Family Medicine | Admitting: Family Medicine

## 2023-10-24 ENCOUNTER — Encounter: Payer: Self-pay | Admitting: Emergency Medicine

## 2023-10-24 DIAGNOSIS — R519 Headache, unspecified: Secondary | ICD-10-CM

## 2023-10-24 DIAGNOSIS — R112 Nausea with vomiting, unspecified: Secondary | ICD-10-CM | POA: Diagnosis not present

## 2023-10-24 LAB — POC INFLUENZA A AND B ANTIGEN (URGENT CARE ONLY)
Influenza A Ag: NEGATIVE
Influenza B Ag: NEGATIVE

## 2023-10-24 MED ORDER — METOCLOPRAMIDE HCL 5 MG/ML IJ SOLN
10.0000 mg | INTRAMUSCULAR | Status: AC
  Administered 2023-10-24: 10 mg via INTRAMUSCULAR

## 2023-10-24 MED ORDER — ONDANSETRON 8 MG PO TBDP: 8.0000 mg | ORAL_TABLET | Freq: Three times a day (TID) | ORAL | 0 refills | Status: DC | PRN

## 2023-10-24 MED ORDER — KETOROLAC TROMETHAMINE 60 MG/2ML IM SOLN
60.0000 mg | Freq: Once | INTRAMUSCULAR | Status: AC
  Administered 2023-10-24: 60 mg via INTRAMUSCULAR

## 2023-10-24 MED ORDER — ONDANSETRON 4 MG PO TBDP
4.0000 mg | ORAL_TABLET | Freq: Once | ORAL | Status: AC
  Administered 2023-10-24: 4 mg via ORAL

## 2023-10-24 NOTE — ED Triage Notes (Signed)
Patient c/o nausea, vomiting, "massive headache" x 2 days.  Exposed to flu.  No OTC meds due to vomiting.

## 2023-10-24 NOTE — Discharge Instructions (Addendum)
Advised patient may take Zofran daily or as needed for nausea.  Encouraged to increase daily water intake to 64 ounces per day 7 days/week.  Advised patient to adhere to bland/brat diet gradually returning to normal diet.  Advised/encouraged patient to take blood pressure medicine daily as directed.  Advised if symptoms worsen and/or unresolved please follow-up with your PCP or here for further evaluation.

## 2023-10-24 NOTE — ED Provider Notes (Signed)
[facility]    CSN: [id number] Arrival date & time: 10/24/23  1327      History   Chief Complaint Chief Complaint  Patient presents with   Nausea    HPI Jennifer Conrad is a 63 y.o. female.   HPI 63 year old female presents with nausea and vomiting and massive headache for 2 days.  Patient reports was exposed to a flu.  PMH significant for obesity, HTN, and T2DM.  Past Medical History:  Diagnosis Date   Depression    Diabetes mellitus    Hypertension    Obesity (BMI 30-39.9)    alowly losing weight/smaller portions/exercise 3 X week    Patient Active Problem List   Diagnosis Date Noted   Right shoulder pain 10/29/2014   S/P appendectomy 01/02/2014   GERD (gastroesophageal reflux disease) 11/25/2013   OAB (overactive bladder) 11/25/2013   Depression 02/04/2012   Diabetes mellitus, type II (HCC) 02/04/2012   Major depressive disorder, recurrent episode, moderate (HCC) 09/15/2011   Generalized anxiety disorder 09/15/2011   BINGE EATING DISORDER 06/10/2008   OBSTRUCTIVE SLEEP APNEA 06/10/2008   Migraine headache 06/10/2008   BENIGN POSITIONAL VERTIGO 06/10/2008   ESSENTIAL HYPERTENSION, BENIGN 06/10/2008   Allergic rhinitis 06/10/2008   Diabetes type 2, uncontrolled 05/22/2008   DEPRESSION/ANXIETY 05/22/2008   TRANSAMINASES, SERUM, ELEVATED 05/22/2008    Past Surgical History:  Procedure Laterality Date   ABDOMINAL HYSTERECTOMY     APPENDECTOMY     CARPAL TUNNEL RELEASE  10/2011   CHOLECYSTECTOMY      OB History   No obstetric history on file.      Home Medications    Prior to Admission medications   Medication Sig Start Date End Date Taking? Authorizing Provider  albuterol (PROVENTIL HFA;VENTOLIN HFA) 108 (90 Base) MCG/ACT inhaler Inhale 2 puffs into the lungs every 4 (four) hours as needed for wheezing or shortness of breath. 12/20/16  Yes Lattie Haw, MD  AMBULATORY NON FORMULARY MEDICATION Medication Name: One touch ultra mini  strips  To test blood sugar three times a day. 06/30/12  Yes Breeback, Jade L, PA-C  amLODipine-benazepril (LOTREL) 10-40 MG capsule Take 1 capsule by mouth daily.   Yes [provider]  atorvastatin (LIPITOR) 20 MG tablet Take 20 mg by mouth daily. 07/23/20  Yes [provider]  buPROPion (WELLBUTRIN XL) 150 MG 24 hr tablet Take 2 tablets (300 mg total) by mouth daily. Patient needs to schedule a follow up appointment before more refills. 06/11/15  Yes Breeback, Jade L, PA-C  buPROPion (WELLBUTRIN XL) 300 MG 24 hr tablet TAKE 1 TABLET BY MOUTH EVERY DAY IN THE MORNING 07/18/20  Yes [provider]  escitalopram (LEXAPRO) 20 MG tablet TAKE 1 TABLET BY MOUTH DAILY 04/24/15  Yes Breeback, Jade L, PA-C  ferrous sulfate 325 (65 FE) MG EC tablet Take by mouth. 09/17/22  Yes [provider]  furosemide (LASIX) 40 MG tablet Take 1 tablet by mouth 2 (two) times daily. 10/12/21  Yes [provider]  gabapentin (NEURONTIN) 300 MG capsule Take 1 capsule by mouth 3 (three) times daily. 08/12/22  Yes [provider]  Olmesartan-Amlodipine-HCTZ 40-10-25 MG TABS TAKE 1 TABLET BY MOUTH DAILY. NEED FOLLOW UP APPOINTMENT FOR MORE REFILLS 11/07/15  Yes Breeback, Jade L, PA-C  ondansetron (ZOFRAN-ODT) 8 MG disintegrating tablet Take 1 tablet (8 mg total) by mouth every 8 (eight) hours as needed for nausea or vomiting. 10/24/23  Yes Trevor Iha, FNP  oxybutynin (DITROPAN-XL) 10 MG 24  hr tablet TAKE 1 TABLET (10 MG TOTAL) BY MOUTH DAILY. 10/14/15  Yes Breeback, Jade L, PA-C  pantoprazole (PROTONIX) 40 MG tablet Take 1 tablet (40 mg total) by mouth daily. 10/26/14  Yes Breeback, Jade L, PA-C  XARELTO 20 MG TABS tablet Take 1 tablet by mouth daily. 02/22/23  Yes [provider]  XIGDUO XR 01-999 MG TB24 TAKE ONE TABLET BY MOUTH 2 (TWO) TIMES A DAY WITH MEALS. 02/24/23  Yes [provider]  amoxicillin (AMOXIL) 500 MG capsule Take 1 capsule (500 mg total) by mouth  2 (two) times daily. 08/14/23   Eustace Moore, MD  fluconazole (DIFLUCAN) 150 MG tablet Take by mouth. 08/11/23   [provider]  fluticasone Aleda Grana) 50 MCG/ACT nasal spray Use 2 sprays each nostril twice daily for 3 days, then reduce to once daily 12/08/17   Peyton Najjar, MD  HYDROcodone-acetaminophen (NORCO/VICODIN) 5-325 MG per tablet Take one by mouth at bedtime as needed for pain 11/18/14   Lattie Haw, MD  traMADol (ULTRAM) 50 MG tablet Take 1 tablet (50 mg total) by mouth every 6 (six) hours as needed for pain. 02/19/13   Floydene Flock, MD  triamcinolone ointment (KENALOG) 0.5 % Apply 1 application topically 2 (two) times daily. 08/14/13   Jomarie Longs, PA-C    Family History Family History  Problem Relation Age of Onset   Depression Sister    Drug abuse Cousin    Depression Sister    Diabetes Mother    Cancer Mother    Cancer Father     Social History Social History   Tobacco Use   Smoking status: Never   Smokeless tobacco: Never  Vaping Use   Vaping status: Never Used  Substance Use Topics   Alcohol use: No   Drug use: No     Allergies   Ace inhibitors and Codeine   Review of Systems Review of Systems  Gastrointestinal:  Positive for nausea and vomiting.  Neurological:  Positive for headaches.     Physical Exam Triage Vital Signs ED Triage Vitals  Encounter Vitals Group     BP 10/24/23 1451 (!) 193/97     Systolic BP Percentile --      Diastolic BP Percentile --      Pulse Rate 10/24/23 1451 75     Resp 10/24/23 1451 18     Temp 10/24/23 1451 98.5 F (36.9 C)     Temp Source 10/24/23 1451 Oral     SpO2 10/24/23 1451 95 %     Weight 10/24/23 1453 180 lb (81.6 kg)     Height 10/24/23 1453 5\' 2"  (1.575 m)     Head Circumference --      Peak Flow --      Pain Score 10/24/23 1453 4     Pain Loc --      Pain Education --      Exclude from Growth Chart --    No data found.  Updated Vital Signs BP (!) 193/97 (BP Location:  Left Arm)   Pulse 75   Temp 98.5 F (36.9 C) (Oral)   Resp 18   Ht 5\' 2"  (1.575 m)   Wt 180 lb (81.6 kg)   SpO2 95%   BMI 32.92 kg/m    Physical Exam Vitals and nursing note reviewed.  Constitutional:      Appearance: Normal appearance. She is obese.  HENT:     Head: Normocephalic and atraumatic.  Right Ear: Tympanic membrane, ear canal and external ear normal.     Left Ear: Tympanic membrane, ear canal and external ear normal.     Nose: Nose normal.     Mouth/Throat:     Mouth: Mucous membranes are moist.     Pharynx: Oropharynx is clear.  Eyes:     Extraocular Movements: Extraocular movements intact.     Conjunctiva/sclera: Conjunctivae normal.     Pupils: Pupils are equal, round, and reactive to light.  Cardiovascular:     Rate and Rhythm: Normal rate and regular rhythm.     Pulses: Normal pulses.     Heart sounds: Normal heart sounds.  Pulmonary:     Effort: Pulmonary effort is normal.     Breath sounds: Normal breath sounds. No wheezing, rhonchi or rales.  Musculoskeletal:        General: Normal range of motion.     Cervical back: Normal range of motion and neck supple.  Skin:    General: Skin is warm and dry.  Neurological:     General: No focal deficit present.     Mental Status: She is alert and oriented to person, place, and time. Mental status is at baseline.  Psychiatric:        Mood and Affect: Mood normal.        Behavior: Behavior normal.      UC Treatments / Results  Labs (all labs ordered are listed, but only abnormal results are displayed) Labs Reviewed  POC INFLUENZA A AND B ANTIGEN (URGENT CARE ONLY) - Normal    EKG   Radiology No results found.  Procedures Procedures (including critical care time)  Medications Ordered in UC Medications  ondansetron (ZOFRAN-ODT) disintegrating tablet 4 mg (4 mg Oral Given 10/24/23 1501)  ketorolac (TORADOL) injection 60 mg (60 mg Intramuscular Given 10/24/23 1541)  metoCLOPramide (REGLAN)  injection 10 mg (10 mg Intramuscular Given 10/24/23 1542)    Initial Impression / Assessment and Plan / UC Course  I have reviewed the triage vital signs and the nursing notes.  Pertinent labs & imaging results that were available during my care of the patient were reviewed by me and considered in my medical decision making (see chart for details).     MDM: 1.  Bad headache-Toradol 60 mg IM given once in clinic and prior to discharge, Reglan 10 mg IM given once in clinic and prior to discharge; 2.  Nausea and vomiting, unspecified vomiting type-Zofran 4 mg given once in clinic and prior to discharge, Rx'd Zofran 8 mg disintegrating tablet: Take 1 tablet every 8 hours as needed for nausea. Advised patient may take Zofran daily or as needed for nausea.  Encouraged to increase daily water intake to 64 ounces per day 7 days/week.  Advised patient to adhere to bland/brat diet gradually returning to normal diet.  Advised/encouraged patient to take blood pressure medicine daily as directed.  Advised if symptoms worsen and/or unresolved please follow-up with your PCP or here for further evaluation.  Patient discharged home, hemodynamically stable.  Work note provided to patient per her request prior to discharge. Final Clinical Impressions(s) / UC Diagnoses   Final diagnoses:  Nausea and vomiting, unspecified vomiting type  Bad headache     Discharge Instructions      Advised patient may take Zofran daily or as needed for nausea.  Encouraged to increase daily water intake to 64 ounces per day 7 days/week.  Advised patient to adhere to bland/brat diet gradually returning  to normal diet.  Advised/encouraged patient to take blood pressure medicine daily as directed.  Advised if symptoms worsen and/or unresolved please follow-up with your PCP or here for further evaluation.     ED Prescriptions     Medication Sig Dispense Auth. Provider   ondansetron (ZOFRAN-ODT) 8 MG disintegrating tablet Take 1  tablet (8 mg total) by mouth every 8 (eight) hours as needed for nausea or vomiting. 24 tablet Trevor Iha, FNP      PDMP not reviewed this encounter.   Trevor Iha, FNP 10/24/23 1556

## 2024-02-12 ENCOUNTER — Ambulatory Visit
Admission: RE | Admit: 2024-02-12 | Discharge: 2024-02-12 | Disposition: A | Discharge status: Home / Self Care | Payer: Self-pay | Source: Ambulatory Visit | Attending: Family Medicine | Admitting: Family Medicine

## 2024-02-12 VITALS — BP 181/94 | HR 68 | Temp 100.0°F | Resp 18 | Ht 62.0 in | Wt 189.0 lb

## 2024-02-12 DIAGNOSIS — N3001 Acute cystitis with hematuria: Secondary | ICD-10-CM

## 2024-02-12 LAB — POCT URINALYSIS DIP (MANUAL ENTRY)
Bilirubin, UA: NEGATIVE
Glucose, UA: 500 mg/dL — AB
Ketones, POC UA: NEGATIVE mg/dL
Nitrite, UA: NEGATIVE
Protein Ur, POC: >=300 mg/dL — AB
Spec Grav, UA: 1.025 (ref 1.010–1.025)
Urobilinogen, UA: 0.2 U/dL
pH, UA: 6 (ref 5.0–8.0)

## 2024-02-12 MED ORDER — FLUCONAZOLE 150 MG PO TABS: 150.0000 mg | ORAL_TABLET | Freq: Every day | ORAL | 0 refills | Status: DC

## 2024-02-12 MED ORDER — CIPROFLOXACIN HCL 500 MG PO TABS: 500.0000 mg | ORAL_TABLET | Freq: Two times a day (BID) | ORAL | 0 refills | Status: DC

## 2024-02-12 NOTE — ED Provider Notes (Signed)
 [name]    CSN: [id number] Arrival date & time: 02/12/24  1017      History   Chief Complaint Chief Complaint  Patient presents with   Urinary Frequency    Urimary track infection - Entered by patient    HPI Jennifer Conrad is a 63 y.o. female.   HPI  Patient states she feels like she is coming down with a bladder infection.  She has dysuria and frequency for the last couple of days.  Mild vaginal itching.  Yesterday and today she has had some nausea, and has noticed a low-grade temperature.  No flank pain.  No kidney stone or kidney infection history.  Past Medical History:  Diagnosis Date   Depression    Diabetes mellitus    Hypertension    Obesity (BMI 30-39.9)    alowly losing weight/smaller portions/exercise 3 X week    Patient Active Problem List   Diagnosis Date Noted   Right shoulder pain 10/29/2014   S/P appendectomy 01/02/2014   GERD (gastroesophageal reflux disease) 11/25/2013   OAB (overactive bladder) 11/25/2013   Depression 02/04/2012   Diabetes mellitus, type II (HCC) 02/04/2012   Major depressive disorder, recurrent episode, moderate (HCC) 09/15/2011   Generalized anxiety disorder 09/15/2011   BINGE EATING DISORDER 06/10/2008   OBSTRUCTIVE SLEEP APNEA 06/10/2008   Migraine headache 06/10/2008   BENIGN POSITIONAL VERTIGO 06/10/2008   ESSENTIAL HYPERTENSION, BENIGN 06/10/2008   Allergic rhinitis 06/10/2008   Diabetes type 2, uncontrolled 05/22/2008   DEPRESSION/ANXIETY 05/22/2008   TRANSAMINASES, SERUM, ELEVATED 05/22/2008    Past Surgical History:  Procedure Laterality Date   ABDOMINAL HYSTERECTOMY     APPENDECTOMY     CARPAL TUNNEL RELEASE  10/2011   CHOLECYSTECTOMY      OB History   No obstetric history on file.      Home Medications    Prior to Admission medications   Medication Sig Start Date End Date Taking? Authorizing Provider  albuterol  (PROVENTIL  HFA;VENTOLIN  HFA) 108 (90 Base) MCG/ACT inhaler Inhale 2  puffs into the lungs every 4 (four) hours as needed for wheezing or shortness of breath. 12/20/16  Yes Leon Rajas, MD  AMBULATORY NON FORMULARY MEDICATION Medication Name: One touch ultra mini strips  To test blood sugar three times a day. 06/30/12  Yes Breeback, Jade L, PA-C  amLODipine -benazepril (LOTREL) 10-40 MG capsule Take 1 capsule by mouth daily.   Yes [provider]  atorvastatin (LIPITOR) 20 MG tablet Take 20 mg by mouth daily. 07/23/20  Yes [provider]  buPROPion  (WELLBUTRIN  XL) 150 MG 24 hr tablet Take 2 tablets (300 mg total) by mouth daily. Patient needs to schedule a follow up appointment before more refills. 06/11/15  Yes Breeback, Jade L, PA-C  buPROPion  (WELLBUTRIN  XL) 300 MG 24 hr tablet TAKE 1 TABLET BY MOUTH EVERY DAY IN THE MORNING 07/18/20  Yes [provider]  ciprofloxacin  (CIPRO ) 500 MG tablet Take 1 tablet (500 mg total) by mouth 2 (two) times daily. 02/12/24  Yes Stephany Ehrich, MD  escitalopram  (LEXAPRO ) 20 MG tablet TAKE 1 TABLET BY MOUTH DAILY 04/24/15  Yes Breeback, Jade L, PA-C  ferrous sulfate 325 (65 FE) MG EC tablet Take by mouth. 09/17/22  Yes [provider]  fluconazole (DIFLUCAN) 150 MG tablet Take 1 tablet (150 mg total) by mouth daily. Repeat in 1 week if needed 02/12/24  Yes Stephany Ehrich, MD  fluticasone  (FLONASE ) 50 MCG/ACT nasal spray Use 2 sprays each nostril  twice daily for 3 days, then reduce to once daily 12/08/17  Yes Arloa Lamas, MD  furosemide (LASIX) 40 MG tablet Take 1 tablet by mouth 2 (two) times daily. 10/12/21  Yes [provider]  gabapentin (NEURONTIN) 300 MG capsule Take 1 capsule by mouth 3 (three) times daily. 08/12/22  Yes [provider]  HYDROcodone -acetaminophen  (NORCO/VICODIN) 5-325 MG per tablet Take one by mouth at bedtime as needed for pain 11/18/14  Yes Leon Rajas, MD  Olmesartan -Amlodipine -HCTZ 40-10-25 MG TABS TAKE 1 TABLET BY MOUTH DAILY. NEED FOLLOW UP  APPOINTMENT FOR MORE REFILLS 11/07/15  Yes Breeback, Jade L, PA-C  ondansetron  (ZOFRAN -ODT) 8 MG disintegrating tablet Take 1 tablet (8 mg total) by mouth every 8 (eight) hours as needed for nausea or vomiting. 10/24/23  Yes Leonides Ramp, FNP  oxybutynin  (DITROPAN -XL) 10 MG 24 hr tablet TAKE 1 TABLET (10 MG TOTAL) BY MOUTH DAILY. 10/14/15  Yes Breeback, Jade L, PA-C  pantoprazole  (PROTONIX ) 40 MG tablet Take 1 tablet (40 mg total) by mouth daily. 10/26/14  Yes Breeback, Jade L, PA-C  traMADol  (ULTRAM ) 50 MG tablet Take 1 tablet (50 mg total) by mouth every 6 (six) hours as needed for pain. 02/19/13  Yes Corrinne Din, MD  triamcinolone  ointment (KENALOG ) 0.5 % Apply 1 application topically 2 (two) times daily. 08/14/13  Yes Breeback, Jade L, PA-C  XARELTO 20 MG TABS tablet Take 1 tablet by mouth daily. 02/22/23  Yes [provider]  XIGDUO XR 01-999 MG TB24 TAKE ONE TABLET BY MOUTH 2 (TWO) TIMES A DAY WITH MEALS. 02/24/23  Yes [provider]    Family History Family History  Problem Relation Age of Onset   Depression Sister    Drug abuse Cousin    Depression Sister    Diabetes Mother    Cancer Mother    Cancer Father     Social History Social History   Tobacco Use   Smoking status: Never   Smokeless tobacco: Never  Vaping Use   Vaping status: Never Used  Substance Use Topics   Alcohol use: No   Drug use: No     Allergies   Ace inhibitors and Codeine   Review of Systems Review of Systems  See HPI Physical Exam Triage Vital Signs ED Triage Vitals  Encounter Vitals Group     BP 02/12/24 1030 (!) 181/94     Systolic BP Percentile --      Diastolic BP Percentile --      Pulse Rate 02/12/24 1030 68     Resp 02/12/24 1030 18     Temp 02/12/24 1030 100 F (37.8 C)     Temp Source 02/12/24 1030 Oral     SpO2 02/12/24 1030 90 %     Weight 02/12/24 1031 189 lb (85.7 kg)     Height 02/12/24 1031 5\' 2"  (1.575 m)     Head Circumference --      Peak Flow --       Pain Score 02/12/24 1031 4     Pain Loc --      Pain Education --      Exclude from Growth Chart --    No data found.  Updated Vital Signs BP (!) 181/94 (BP Location: Right Arm)   Pulse 68   Temp 100 F (37.8 C) (Oral)   Resp 18   Ht 5\' 2"  (1.575 m)   Wt 85.7 kg   SpO2 90%   BMI 34.57 kg/m  Physical Exam Constitutional:      General: She is not in acute distress.    Appearance: She is well-developed.  HENT:     Head: Normocephalic and atraumatic.  Eyes:     Conjunctiva/sclera: Conjunctivae normal.     Pupils: Pupils are equal, round, and reactive to light.  Cardiovascular:     Rate and Rhythm: Normal rate.  Pulmonary:     Effort: Pulmonary effort is normal. No respiratory distress.  Abdominal:     General: There is no distension.     Palpations: Abdomen is soft.     Tenderness: There is no abdominal tenderness. There is no right CVA tenderness or left CVA tenderness.  Musculoskeletal:        General: Normal range of motion.     Cervical back: Normal range of motion.  Skin:    General: Skin is warm and dry.  Neurological:     Mental Status: She is alert.      UC Treatments / Results  Labs (all labs ordered are listed, but only abnormal results are displayed) Labs Reviewed  POCT URINALYSIS DIP (MANUAL ENTRY) - Abnormal; Notable for the following components:      Result Value   Clarity, UA cloudy (*)    Glucose, UA =500 (*)    Blood, UA moderate (*)    Protein Ur, POC >=300 (*)    Leukocytes, UA Small (1+) (*)    All other components within normal limits  URINE CULTURE    EKG   Radiology No results found.  Procedures Procedures (including critical care time)  Medications Ordered in UC Medications - No data to display  Initial Impression / Assessment and Plan / UC Course  I have reviewed the triage vital signs and the nursing notes.  Pertinent labs & imaging results that were available during my care of the patient were reviewed by me  and considered in my medical decision making (see chart for details).     I am concerned that with a urinary tract infection she has low-grade fever and nausea.  She has no CVA tenderness or abdominal tenderness.  Will treat with Cipro  for 7 days in case there is early kidney involvement.  Culture is sent Final Clinical Impressions(s) / UC Diagnoses   Final diagnoses:  Acute cystitis with hematuria   Discharge Instructions      Take Cipro  2 times a day for 1 week I have also prescribed Diflucan for yeast infection Make sure you are drinking lots of water  Your urine has been sent to the laboratory for culture.  You will be called if any change in antibiotic is indicated  ED Prescriptions     Medication Sig Dispense Auth. Provider   ciprofloxacin  (CIPRO ) 500 MG tablet Take 1 tablet (500 mg total) by mouth 2 (two) times daily. 14 tablet Stephany Ehrich, MD   fluconazole (DIFLUCAN) 150 MG tablet Take 1 tablet (150 mg total) by mouth daily. Repeat in 1 week if needed 2 tablet Stephany Ehrich, MD      PDMP not reviewed this encounter.   Stephany Ehrich, MD 02/12/24 1256

## 2024-02-12 NOTE — ED Triage Notes (Signed)
 Patient c/o dysuria and urinary frequency for a couple of days.  Also c/o vaginal itching on the outside.  Denies any hematuria or any OTC meds.

## 2024-02-12 NOTE — Discharge Instructions (Signed)
 Take Cipro  2 times a day for 1 week I have also prescribed Diflucan for yeast infection Make sure you are drinking lots of water  Your urine has been sent to the laboratory for culture.  You will be called if any change in antibiotic is indicated

## 2024-02-14 ENCOUNTER — Ambulatory Visit (HOSPITAL_BASED_OUTPATIENT_CLINIC_OR_DEPARTMENT_OTHER): Payer: Self-pay

## 2024-02-14 LAB — URINE CULTURE
Culture: >=100000 — AB
Special Requests: NORMAL

## 2024-07-19 ENCOUNTER — Ambulatory Visit

## 2024-07-19 ENCOUNTER — Ambulatory Visit
Admission: EM | Admit: 2024-07-19 | Discharge: 2024-07-19 | Disposition: A | Discharge status: Home / Self Care | Attending: Family Medicine | Admitting: Family Medicine

## 2024-07-19 ENCOUNTER — Encounter: Payer: Self-pay | Admitting: Emergency Medicine

## 2024-07-19 DIAGNOSIS — W19XXXA Unspecified fall, initial encounter: Secondary | ICD-10-CM

## 2024-07-19 DIAGNOSIS — R0789 Other chest pain: Secondary | ICD-10-CM

## 2024-07-19 DIAGNOSIS — M25511 Pain in right shoulder: Secondary | ICD-10-CM

## 2024-07-19 MED ORDER — HYDROCODONE-ACETAMINOPHEN 7.5-325 MG PO TABS: 1.0000 | ORAL_TABLET | Freq: Four times a day (QID) | ORAL | 0 refills | Status: AC | PRN

## 2024-07-19 NOTE — ED Provider Notes (Signed)
 [facility]    CSN: [id number] Arrival date & time: 07/19/24  1542      History   Chief Complaint Chief Complaint  Patient presents with   Fall    HPI Jennifer Conrad is a 63 y.o. female.   Patient is known to me from prior visits.  She is here today for right shoulder pain.  She fell in her bathroom 6 days ago.  Still has limited use of her shoulder.  Is having difficulty sleeping due to shoulder pain.  Is here for evaluation. Patient has hypertension, hyperlipidemia, diabetes, and depression.  Frequent falls.  Had a fracture in September   Patient is on chronic anticoagulation with Xarelto due to history of pulmonary embolism  Past Medical History:  Diagnosis Date   Depression    Diabetes mellitus    Hypertension    Obesity (BMI 30-39.9)    alowly losing weight/smaller portions/exercise 3 X week    Patient Active Problem List   Diagnosis Date Noted   Right shoulder pain 10/29/2014   S/P appendectomy 01/02/2014   GERD (gastroesophageal reflux disease) 11/25/2013   OAB (overactive bladder) 11/25/2013   Depression 02/04/2012   Diabetes mellitus, type II (HCC) 02/04/2012   Major depressive disorder, recurrent episode, moderate (HCC) 09/15/2011   Generalized anxiety disorder 09/15/2011   BINGE EATING DISORDER 06/10/2008   OBSTRUCTIVE SLEEP APNEA 06/10/2008   Migraine headache 06/10/2008   BENIGN POSITIONAL VERTIGO 06/10/2008   ESSENTIAL HYPERTENSION, BENIGN 06/10/2008   Allergic rhinitis 06/10/2008   Diabetes type 2, uncontrolled 05/22/2008   DEPRESSION/ANXIETY 05/22/2008   TRANSAMINASES, SERUM, ELEVATED 05/22/2008    Past Surgical History:  Procedure Laterality Date   ABDOMINAL HYSTERECTOMY     APPENDECTOMY     CARPAL TUNNEL RELEASE  10/2011   CHOLECYSTECTOMY      OB History   No obstetric history on file.      Home Medications    Prior to Admission medications   Medication Sig Start Date End Date Taking? Authorizing Provider   albuterol  (PROVENTIL  HFA;VENTOLIN  HFA) 108 (90 Base) MCG/ACT inhaler Inhale 2 puffs into the lungs every 4 (four) hours as needed for wheezing or shortness of breath. 12/20/16  Yes Pauline Garnette LABOR, MD  AMBULATORY NON FORMULARY MEDICATION Medication Name: One touch ultra mini strips  To test blood sugar three times a day. 06/30/12  Yes Breeback, Jade L, PA-C  amLODipine -benazepril (LOTREL) 10-40 MG capsule Take 1 capsule by mouth daily.   Yes [provider]  atorvastatin (LIPITOR) 20 MG tablet Take 20 mg by mouth daily. 07/23/20  Yes [provider]  buPROPion  (WELLBUTRIN  XL) 150 MG 24 hr tablet Take 2 tablets (300 mg total) by mouth daily. Patient needs to schedule a follow up appointment before more refills. 06/11/15  Yes Breeback, Jade L, PA-C  doxepin (SINEQUAN) 10 MG capsule Take 10-20 mg by mouth at bedtime as needed. 07/02/24  Yes [provider]  escitalopram  (LEXAPRO ) 20 MG tablet TAKE 1 TABLET BY MOUTH DAILY 04/24/15  Yes Breeback, Jade L, PA-C  ferrous sulfate 325 (65 FE) MG EC tablet Take by mouth. 09/17/22  Yes [provider]  furosemide (LASIX) 40 MG tablet Take 1 tablet by mouth 2 (two) times daily. 10/12/21  Yes [provider]  gabapentin (NEURONTIN) 300 MG capsule Take 1 capsule by mouth 3 (three) times daily. 08/12/22  Yes [provider]  HYDROcodone -acetaminophen  (NORCO) 7.5-325 MG tablet Take 1 tablet by mouth every 6 (six) hours as needed  for moderate pain (pain score 4-6) or severe pain (pain score 7-10). 07/19/24  Yes Maranda Jamee Jacob, MD  metFORMIN  (GLUCOPHAGE ) 1000 MG tablet Take 1,000 mg by mouth. 09/15/11  Yes [provider]  Olmesartan -Amlodipine -HCTZ 40-10-25 MG TABS TAKE 1 TABLET BY MOUTH DAILY. NEED FOLLOW UP APPOINTMENT FOR MORE REFILLS 11/07/15  Yes Breeback, Jade L, PA-C  oxybutynin  (DITROPAN -XL) 10 MG 24 hr tablet TAKE 1 TABLET (10 MG TOTAL) BY MOUTH DAILY. 10/14/15  Yes Breeback, Jade L, PA-C  XARELTO 20 MG  TABS tablet Take 1 tablet by mouth daily. 02/22/23  Yes [provider]    Family History Family History  Problem Relation Age of Onset   Depression Sister    Drug abuse Cousin    Depression Sister    Diabetes Mother    Cancer Mother    Cancer Father     Social History Social History   Tobacco Use   Smoking status: Never   Smokeless tobacco: Never  Vaping Use   Vaping status: Never Used  Substance Use Topics   Alcohol use: No   Drug use: No     Allergies   Ace inhibitors and Codeine   Review of Systems Review of Systems See HPI  Physical Exam Triage Vital Signs ED Triage Vitals  Encounter Vitals Group     BP 07/19/24 1600 (!) 206/107     Girls Systolic BP Percentile --      Girls Diastolic BP Percentile --      Boys Systolic BP Percentile --      Boys Diastolic BP Percentile --      Pulse Rate 07/19/24 1600 83     Resp 07/19/24 1600 18     Temp 07/19/24 1600 98.7 F (37.1 C)     Temp Source 07/19/24 1600 Oral     SpO2 07/19/24 1600 90 %     Weight 07/19/24 1558 185 lb (83.9 kg)     Height 07/19/24 1558 5' 2 (1.575 m)     Head Circumference --      Peak Flow --      Pain Score 07/19/24 1557 7     Pain Loc --      Pain Education --      Exclude from Growth Chart --    No data found.  Updated Vital Signs BP (!) 206/107 (BP Location: Left Arm)   Pulse 83   Temp 98.7 F (37.1 C) (Oral)   Resp 18   Ht 5' 2 (1.575 m)   Wt 83.9 kg   SpO2 90%   BMI 33.84 kg/m   Physical Exam Constitutional:      General: She is in acute distress.     Appearance: She is well-developed.     Comments: Uncomfortable.  Holding right arm close to body  HENT:     Head: Normocephalic and atraumatic.  Eyes:     Conjunctiva/sclera: Conjunctivae normal.     Pupils: Pupils are equal, round, and reactive to light.  Cardiovascular:     Rate and Rhythm: Normal rate.     Heart sounds: Normal heart sounds.  Pulmonary:     Effort: Pulmonary effort is normal. No  respiratory distress.     Breath sounds: Normal breath sounds.  Chest:     Chest wall: Tenderness present.  Musculoskeletal:        General: Tenderness present. Normal range of motion.     Cervical back: Normal range of motion.  Comments: Pain with range of motion of right shoulder.  Mild tenderness over the upper humerus.  Moderate tenderness in the axilla around the rib cage in the upper right pectoral region.  Skin:    General: Skin is warm and dry.  Neurological:     Mental Status: She is alert.      UC Treatments / Results  Labs (all labs ordered are listed, but only abnormal results are displayed) Labs Reviewed - No data to display  EKG   Radiology DG Shoulder Right Result Date: 07/19/2024 EXAM: 1 VIEW(S) XRAY OF THE RIGHT SHOULDER 07/19/2024 04:17:00 PM COMPARISON: 11/18/2014 CLINICAL HISTORY: fall on right shoulder FINDINGS: BONES AND JOINTS: Glenohumeral joint is normally aligned. No acute fracture or dislocation. The [facility] joint is unremarkable in appearance. SOFT TISSUES: No abnormal calcifications. Visualized lung is unremarkable. IMPRESSION: 1. No acute fracture or dislocation identified. Electronically signed by: Lynwood Seip MD 07/19/2024 04:26 PM EST RP Workstation: [id number]     Medications Ordered in UC Medications - No data to display  Initial Impression / Assessment and Plan / UC Course  I have reviewed the triage vital signs and the nursing notes.  Pertinent labs & imaging results that were available during my care of the patient were reviewed by me and considered in my medical decision making (see chart for details).     Patient landed right on her shoulder, and did injure her right ribs.  Looking at the x-rays I do not see any fracture of the ribs and her shoulder x-ray is clearly negative.  Discussed ice, rest, and Tylenol  for pain.  Prescribing oxycodone for when pain is severe and to help her sleep at night.  Instructed to follow-up with PCP if not  improving by next week Final Clinical Impressions(s) / UC Diagnoses   Final diagnoses:  Fall, initial encounter  Acute pain of right shoulder  Anterior chest wall pain     Discharge Instructions      May take Tylenol  for mild or moderate pain Take hydrocodone  when pain is severe Do not take hydrocodone  and drive Hopefully hydrocodone  will help you sleep better at night See your doctor if not improving by next week  Follow-up on your elevated blood pressure     ED Prescriptions     Medication Sig Dispense Auth. Provider   HYDROcodone -acetaminophen  (NORCO) 7.5-325 MG tablet Take 1 tablet by mouth every 6 (six) hours as needed for moderate pain (pain score 4-6) or severe pain (pain score 7-10). 10 tablet Maranda Jamee Jacob, MD      I have reviewed the PDMP during this encounter.   Maranda Jamee Jacob, MD 07/19/24 1640

## 2024-07-19 NOTE — ED Triage Notes (Signed)
 Patient states that she slipped getting out of the shower x 6 days ago.  Patient did land on her right shoulder and has been having pain since.  Has taken Ibuprofen, unable to lay flat.  No LOC.

## 2024-07-19 NOTE — Discharge Instructions (Signed)
 May take Tylenol  for mild or moderate pain Take hydrocodone  when pain is severe Do not take hydrocodone  and drive Hopefully hydrocodone  will help you sleep better at night See your doctor if not improving by next week  Follow-up on your elevated blood pressure
